# Patient Record
Sex: Male | Born: 1935 | Race: White | Hispanic: No | Marital: Single | Smoking: Never smoker
Health system: Southern US, Community
[De-identification: ages and names within clinical notes are randomized; demographics above are authoritative.]

## PROBLEM LIST (undated history)

## (undated) DIAGNOSIS — E785 Hyperlipidemia, unspecified: Secondary | ICD-10-CM

## (undated) DIAGNOSIS — K219 Gastro-esophageal reflux disease without esophagitis: Secondary | ICD-10-CM

## (undated) DIAGNOSIS — I10 Essential (primary) hypertension: Secondary | ICD-10-CM

## (undated) DIAGNOSIS — N289 Disorder of kidney and ureter, unspecified: Secondary | ICD-10-CM

## (undated) DIAGNOSIS — I441 Atrioventricular block, second degree: Secondary | ICD-10-CM

## (undated) DIAGNOSIS — I480 Paroxysmal atrial fibrillation: Secondary | ICD-10-CM

## (undated) DIAGNOSIS — G459 Transient cerebral ischemic attack, unspecified: Secondary | ICD-10-CM

## (undated) DIAGNOSIS — I2510 Atherosclerotic heart disease of native coronary artery without angina pectoris: Secondary | ICD-10-CM

## (undated) DIAGNOSIS — G4730 Sleep apnea, unspecified: Secondary | ICD-10-CM

## (undated) DIAGNOSIS — M1990 Unspecified osteoarthritis, unspecified site: Secondary | ICD-10-CM

## (undated) DIAGNOSIS — I4892 Unspecified atrial flutter: Secondary | ICD-10-CM

## (undated) DIAGNOSIS — C61 Malignant neoplasm of prostate: Secondary | ICD-10-CM

## (undated) HISTORY — PX: ESOPHAGOGASTRIC FUNDOPLICATION: SHX405

## (undated) HISTORY — DX: Transient cerebral ischemic attack, unspecified: G45.9

## (undated) HISTORY — DX: Atherosclerotic heart disease of native coronary artery without angina pectoris: I25.10

## (undated) HISTORY — PX: OTHER SURGICAL HISTORY: SHX169

## (undated) HISTORY — DX: Disorder of kidney and ureter, unspecified: N28.9

## (undated) HISTORY — DX: Sleep apnea, unspecified: G47.30

## (undated) HISTORY — DX: Essential (primary) hypertension: I10

## (undated) HISTORY — PX: HERNIA REPAIR: SHX51

## (undated) HISTORY — PX: A FLUTTER ABLATION: SHX5348

## (undated) HISTORY — DX: Hyperlipidemia, unspecified: E78.5

## (undated) HISTORY — DX: Malignant neoplasm of prostate: C61

---

## 2004-08-21 ENCOUNTER — Inpatient Hospital Stay: Payer: Self-pay | Admitting: Internal Medicine

## 2004-08-21 ENCOUNTER — Other Ambulatory Visit: Payer: Self-pay

## 2004-08-22 ENCOUNTER — Other Ambulatory Visit: Payer: Self-pay

## 2005-08-24 ENCOUNTER — Ambulatory Visit: Payer: Self-pay | Admitting: Pain Medicine

## 2005-08-26 ENCOUNTER — Ambulatory Visit: Payer: Self-pay | Admitting: Unknown Physician Specialty

## 2005-09-07 ENCOUNTER — Ambulatory Visit: Payer: Self-pay | Admitting: Pain Medicine

## 2005-09-20 ENCOUNTER — Ambulatory Visit: Payer: Self-pay | Admitting: Pain Medicine

## 2005-09-21 ENCOUNTER — Encounter: Payer: Self-pay | Admitting: Pain Medicine

## 2006-05-18 ENCOUNTER — Ambulatory Visit: Payer: Self-pay

## 2006-05-22 ENCOUNTER — Emergency Department: Payer: Self-pay | Admitting: Emergency Medicine

## 2006-05-22 ENCOUNTER — Other Ambulatory Visit: Payer: Self-pay

## 2006-06-09 ENCOUNTER — Ambulatory Visit: Payer: Self-pay

## 2007-03-30 ENCOUNTER — Ambulatory Visit: Payer: Self-pay | Admitting: Radiation Oncology

## 2007-04-02 ENCOUNTER — Ambulatory Visit: Payer: Self-pay | Admitting: Radiation Oncology

## 2007-04-18 ENCOUNTER — Ambulatory Visit: Payer: Self-pay | Admitting: Internal Medicine

## 2007-04-27 ENCOUNTER — Ambulatory Visit: Payer: Self-pay | Admitting: Cardiovascular Disease

## 2007-04-27 ENCOUNTER — Ambulatory Visit (HOSPITAL_COMMUNITY): Admission: RE | Admit: 2007-04-27 | Discharge: 2007-04-28 | Payer: Self-pay | Admitting: Cardiovascular Disease

## 2007-05-02 ENCOUNTER — Ambulatory Visit: Payer: Self-pay | Admitting: Radiation Oncology

## 2007-05-10 ENCOUNTER — Ambulatory Visit: Payer: Self-pay | Admitting: Internal Medicine

## 2007-06-02 ENCOUNTER — Ambulatory Visit: Payer: Self-pay | Admitting: Radiation Oncology

## 2007-07-03 ENCOUNTER — Ambulatory Visit: Payer: Self-pay | Admitting: Radiation Oncology

## 2007-07-06 ENCOUNTER — Ambulatory Visit: Payer: Self-pay | Admitting: Ophthalmology

## 2007-08-02 ENCOUNTER — Ambulatory Visit: Payer: Self-pay | Admitting: Radiation Oncology

## 2007-08-25 ENCOUNTER — Ambulatory Visit: Payer: Self-pay | Admitting: Cardiology

## 2007-08-29 ENCOUNTER — Ambulatory Visit: Payer: Self-pay | Admitting: Cardiology

## 2007-08-29 LAB — CONVERTED CEMR LAB
Basophils Absolute: 0 10*3/uL (ref 0.0–0.1)
Basophils Relative: 0 % (ref 0.0–1.0)
CO2: 33 meq/L — ABNORMAL HIGH (ref 19–32)
Calcium: 9.6 mg/dL (ref 8.4–10.5)
Eosinophils Relative: 4.4 % (ref 0.0–5.0)
Glucose, Bld: 100 mg/dL — ABNORMAL HIGH (ref 70–99)
Hemoglobin: 14 g/dL (ref 13.0–17.0)
MCV: 98.2 fL (ref 78.0–100.0)
Monocytes Absolute: 1.1 10*3/uL — ABNORMAL HIGH (ref 0.2–0.7)
Monocytes Relative: 17.1 % — ABNORMAL HIGH (ref 3.0–11.0)
Neutro Abs: 4 10*3/uL (ref 1.4–7.7)
Neutrophils Relative %: 64.9 % (ref 43.0–77.0)
WBC: 6.3 10*3/uL (ref 4.5–10.5)
aPTT: 28.6 s (ref 21.7–29.8)

## 2007-09-02 ENCOUNTER — Ambulatory Visit: Payer: Self-pay | Admitting: Radiation Oncology

## 2007-09-04 ENCOUNTER — Inpatient Hospital Stay (HOSPITAL_COMMUNITY): Admission: AD | Admit: 2007-09-04 | Discharge: 2007-09-06 | Payer: Self-pay | Admitting: Cardiovascular Disease

## 2007-09-04 ENCOUNTER — Inpatient Hospital Stay (HOSPITAL_BASED_OUTPATIENT_CLINIC_OR_DEPARTMENT_OTHER): Admission: RE | Admit: 2007-09-04 | Discharge: 2007-09-04 | Payer: Self-pay | Admitting: Cardiovascular Disease

## 2007-09-04 ENCOUNTER — Ambulatory Visit: Payer: Self-pay | Admitting: Cardiovascular Disease

## 2007-09-25 ENCOUNTER — Ambulatory Visit: Payer: Self-pay | Admitting: Cardiology

## 2007-11-20 ENCOUNTER — Ambulatory Visit: Payer: Self-pay | Admitting: Cardiology

## 2007-11-30 ENCOUNTER — Ambulatory Visit: Payer: Self-pay | Admitting: Radiation Oncology

## 2007-12-03 ENCOUNTER — Ambulatory Visit: Payer: Self-pay | Admitting: Radiation Oncology

## 2008-05-10 ENCOUNTER — Ambulatory Visit: Payer: Self-pay | Admitting: Cardiology

## 2008-09-12 ENCOUNTER — Ambulatory Visit: Payer: Self-pay | Admitting: Cardiology

## 2008-11-01 ENCOUNTER — Ambulatory Visit: Payer: Self-pay | Admitting: Radiation Oncology

## 2008-11-28 ENCOUNTER — Ambulatory Visit: Payer: Self-pay | Admitting: Radiation Oncology

## 2008-12-02 ENCOUNTER — Ambulatory Visit: Payer: Self-pay | Admitting: Radiation Oncology

## 2009-02-13 ENCOUNTER — Telehealth: Payer: Self-pay | Admitting: Cardiology

## 2009-03-28 DIAGNOSIS — I2510 Atherosclerotic heart disease of native coronary artery without angina pectoris: Secondary | ICD-10-CM | POA: Insufficient documentation

## 2009-03-28 DIAGNOSIS — C61 Malignant neoplasm of prostate: Secondary | ICD-10-CM

## 2009-03-28 DIAGNOSIS — E119 Type 2 diabetes mellitus without complications: Secondary | ICD-10-CM

## 2009-03-28 DIAGNOSIS — G459 Transient cerebral ischemic attack, unspecified: Secondary | ICD-10-CM | POA: Insufficient documentation

## 2009-03-28 DIAGNOSIS — G4730 Sleep apnea, unspecified: Secondary | ICD-10-CM | POA: Insufficient documentation

## 2009-03-28 DIAGNOSIS — E785 Hyperlipidemia, unspecified: Secondary | ICD-10-CM

## 2009-03-28 DIAGNOSIS — I4891 Unspecified atrial fibrillation: Secondary | ICD-10-CM | POA: Insufficient documentation

## 2009-03-28 DIAGNOSIS — N259 Disorder resulting from impaired renal tubular function, unspecified: Secondary | ICD-10-CM | POA: Insufficient documentation

## 2009-03-28 DIAGNOSIS — I10 Essential (primary) hypertension: Secondary | ICD-10-CM | POA: Insufficient documentation

## 2009-04-04 ENCOUNTER — Ambulatory Visit: Payer: Self-pay | Admitting: Cardiology

## 2009-04-04 DIAGNOSIS — R0989 Other specified symptoms and signs involving the circulatory and respiratory systems: Secondary | ICD-10-CM

## 2009-04-28 ENCOUNTER — Encounter: Payer: Self-pay | Admitting: Cardiology

## 2009-04-28 ENCOUNTER — Ambulatory Visit: Payer: Self-pay

## 2009-05-01 ENCOUNTER — Telehealth (INDEPENDENT_AMBULATORY_CARE_PROVIDER_SITE_OTHER): Payer: Self-pay | Admitting: *Deleted

## 2009-07-14 ENCOUNTER — Encounter: Payer: Self-pay | Admitting: Cardiology

## 2009-07-17 ENCOUNTER — Telehealth: Payer: Self-pay | Admitting: Cardiology

## 2009-07-18 ENCOUNTER — Encounter (INDEPENDENT_AMBULATORY_CARE_PROVIDER_SITE_OTHER): Payer: Self-pay | Admitting: *Deleted

## 2009-07-22 ENCOUNTER — Telehealth: Payer: Self-pay | Admitting: Cardiology

## 2009-08-20 ENCOUNTER — Telehealth (INDEPENDENT_AMBULATORY_CARE_PROVIDER_SITE_OTHER): Payer: Self-pay | Admitting: *Deleted

## 2009-10-06 ENCOUNTER — Encounter: Payer: Self-pay | Admitting: Cardiology

## 2009-10-07 ENCOUNTER — Ambulatory Visit: Payer: Self-pay | Admitting: Cardiology

## 2009-12-01 ENCOUNTER — Ambulatory Visit: Payer: Self-pay | Admitting: Cardiology

## 2009-12-01 LAB — CONVERTED CEMR LAB
Albumin: 3.7 g/dL (ref 3.5–5.2)
Bilirubin, Direct: 0.1 mg/dL (ref 0.0–0.3)
Cholesterol: 124 mg/dL (ref 0–200)
LDL Cholesterol: 57 mg/dL (ref 0–99)
Total Bilirubin: 0.6 mg/dL (ref 0.3–1.2)
Total Protein: 6.6 g/dL (ref 6.0–8.3)
VLDL: 32.4 mg/dL (ref 0.0–40.0)

## 2009-12-02 HISTORY — PX: CAROTID STENT: SHX1301

## 2010-01-05 ENCOUNTER — Encounter: Payer: Self-pay | Admitting: Cardiology

## 2010-01-05 ENCOUNTER — Inpatient Hospital Stay: Payer: Self-pay | Admitting: Internal Medicine

## 2010-01-05 ENCOUNTER — Ambulatory Visit: Payer: Self-pay | Admitting: Cardiovascular Disease

## 2010-01-06 ENCOUNTER — Encounter: Payer: Self-pay | Admitting: Cardiology

## 2010-01-07 ENCOUNTER — Encounter: Payer: Self-pay | Admitting: Cardiovascular Disease

## 2010-01-07 ENCOUNTER — Encounter: Payer: Self-pay | Admitting: Cardiology

## 2010-01-09 ENCOUNTER — Encounter: Payer: Self-pay | Admitting: Cardiovascular Disease

## 2010-01-09 ENCOUNTER — Encounter: Payer: Self-pay | Admitting: Cardiology

## 2010-03-16 ENCOUNTER — Observation Stay: Payer: Self-pay | Admitting: Internal Medicine

## 2010-03-16 ENCOUNTER — Encounter: Payer: Self-pay | Admitting: Cardiology

## 2010-03-17 ENCOUNTER — Encounter: Payer: Self-pay | Admitting: Cardiology

## 2010-03-18 ENCOUNTER — Encounter: Payer: Self-pay | Admitting: Cardiology

## 2010-03-19 ENCOUNTER — Encounter: Payer: Self-pay | Admitting: Cardiology

## 2010-04-07 ENCOUNTER — Ambulatory Visit: Payer: Self-pay | Admitting: Cardiology

## 2010-04-10 ENCOUNTER — Encounter: Payer: Self-pay | Admitting: Cardiology

## 2010-07-08 ENCOUNTER — Encounter: Payer: Self-pay | Admitting: Cardiology

## 2010-07-27 ENCOUNTER — Telehealth: Payer: Self-pay | Admitting: Cardiology

## 2010-07-30 ENCOUNTER — Telehealth: Payer: Self-pay | Admitting: Cardiology

## 2010-10-08 ENCOUNTER — Encounter: Payer: Self-pay | Admitting: Cardiology

## 2010-10-08 ENCOUNTER — Ambulatory Visit: Payer: Self-pay | Admitting: Cardiology

## 2010-10-14 ENCOUNTER — Telehealth (INDEPENDENT_AMBULATORY_CARE_PROVIDER_SITE_OTHER): Payer: Self-pay | Admitting: *Deleted

## 2010-12-02 NOTE — Assessment & Plan Note (Signed)
Summary: 6 MONTH ROV/SL   Visit Type:  Follow-up Primary Provider:  [name], MD  CC:  CAD.  History of Present Illness: The Patient presents for followup of coronary disease. Since I last saw him he was hospitalized at [facility] with a TIA. He was found to have right carotid artery stenosis and had stenting of this. He was discharged on Plavix in addition to his Coumadin. Aspirin is currently being held. I did review all available records and see that he had an echocardiogram demonstrating an EF of 55% with no significant abnormalities. Stress perfusion study demonstrated mid and basilar hypoperfusion but was a low risk scan. Hemoglobin A1c was 7.2. HDL 29, LDL 63. Since that hospitalization he has been doing well. He is out and walking around. He is having no chest pressure, neck or arm discomfort. He is having no palpitations, presyncope or syncope. He is having no new shortness of breath, PND or orthopnea.  Current Medications (verified): 1)  Carvedilol 3.125 Mg Tabs (Carvedilol) .... Take 1 Tablet By Mouth Twice A Day 2)  Isosorbide Mononitrate Cr 60 Mg Xr24h-Tab (Isosorbide Mononitrate) .Marland Kitchen.. 1 By Mouth Daily 3)  Niaspan 1000 Mg Cr-Tabs (Niacin (Antihyperlipidemic)) .... One and 1/2 Tablets Daily 4)  Simvastatin 40 Mg Tabs (Simvastatin) 5)  Hydrochlorothiazide 25 Mg Tabs (Hydrochlorothiazide) .... Daily 6)  Aspirin 81 Mg  Tabs (Aspirin) .... Daily 7)  Warfarin .... As Directed 8)  Multivitamins   Tabs (Multiple Vitamin) .... Daily 9)  Fish Oil   Oil (Fish Oil) .... Daily 10)  Melatonin 1.5mg  .... Daily 11)  Nitrostat 0.4 Mg Subl (Nitroglycerin) .... As Needed 12)  Testosterone Cypionate 200 Mg/ml Oil (Testosterone Cypionate) .... As Directed 13)  Plavix 75 Mg Tabs (Clopidogrel Bisulfate) .Marland Kitchen.. 1 By Mouth Daily  Allergies (verified): No Known Drug Allergies  Past History:  Past Medical History:  1. Coronary artery disease (catheterization in November 2008.  He had       a long  restenotic area, 95% stenosis in the previously placed LAD       stent.  The distal LAD had 30-40% stenosis.  Intermediate had 30-       40% stenosis.  The circumflex had 40% stenosis in obtuse marginal.       The right coronary artery had 30% followed by 70-80% stenosis       followed by total occlusion.  There were left-to-right collaterals.       He had stenting with a Taxus stent in the LAD.  The EF was 65%).   2. Paroxysmal atrial fibrillation on chronic Coumadin therapy.   3. Questionable transient ischemic attacks.   4. Sleep apnea.   5. Prostate cancer status post radiation.   6. Diabetes mellitus.   7. Hypertension.   8. Dyslipidemia.   9. Renal insufficiency.   10. TIA  11. Chronic renal insufficiency  Past Surgical History: Fundoplication.  Right carotid artery stenting (February 2011)  Review of Systems       As stated in the HPI and negative for all other systems.   Vital Signs:  Patient profile:   77 year old male Height:      71 inches Weight:      200 pounds BMI:     28.00 Pulse rate:   68 / minute Resp:     16 per minute BP sitting:   122 / 64  (right arm)  Vitals Entered By: Marrion Coy, CNA (April 07, 2010 1:44 PM)  Physical Exam  General:  Well developed, well nourished, in no acute distress. Head:  normocephalic and atraumatic Neck:  Neck supple, no JVD. No masses, thyromegaly or abnormal cervical nodes. Chest Wall:  no deformities or breast masses noted Lungs:  Clear bilaterally to auscultation and percussion. Abdomen:  Bowel sounds positive; abdomen soft and non-tender without masses, organomegaly, or hernias noted. No hepatosplenomegaly. Msk:  Back normal, normal gait. Muscle strength and tone normal. Extremities:  No clubbing or cyanosis. Neurologic:  Alert and oriented x 3. Skin:  Intact without lesions or rashes. Psych:  Normal affect.   Detailed Cardiovascular Exam  Neck    Carotids: right carotid bruit:.      Neck Veins: Normal, no  JVD.    Heart    Inspection: no deformities or lifts noted.      Palpation: normal PMI with no thrills palpable.      Auscultation: regular rate and rhythm, S1, S2 without murmurs, rubs, gallops, or clicks.    Vascular    Abdominal Aorta: no palpable masses, pulsations, or audible bruits.      Femoral Pulses: normal femoral pulses bilaterally.      Pedal Pulses: normal pedal pulses bilaterally.      Radial Pulses: normal radial pulses bilaterally.      Peripheral Circulation: no clubbing, cyanosis, or edema noted with normal capillary refill.     Impression & Recommendations:  Problem # 1:  CAD (ICD-414.00) The patient had a negative stress perfusion study or at least low risk.he will continue with risk reduction.  Problem # 2:  HYPERTENSION (ICD-401.9) His blood pressure is controlled. He will continue on the meds as listed.  Problem # 3:  ATRIAL FIBRILLATION, PAROXYSMAL (ICD-427.31) She remains on Coumadin. We discussed Plavix and his instruction is to continue the current prescription which last about 3 months longer. He will then restart aspirin and continue on Coumadin when the Plavix runs out.  Problem # 4:  DYSLIPIDEMIA (ICD-272.4) I reviewed his lipid profile. His HDL was still 29 with an LDL of 63. He does not want to take more niacin. He'll increase his walking to try to treat his HDL. There is also a beta neck cranberry juice might increase this. We discussed this.  Patient Instructions: 1)  Your physician recommends that you schedule a follow-up appointment in: 6 months with Dr [name] 2)  Your physician recommends that you continue on your current medications as directed. Please refer to the Current Medication list given to you today.

## 2010-12-02 NOTE — Assessment & Plan Note (Signed)
Summary: 6 month rov 414.01  due 12/11   Visit Type:  Follow-up Primary Roux Brandy:  [name], MD  CC:  CAD and Atrial Fibrillation.  History of Present Illness: The patient presents for routine followup. Since I last saw him he has had no cardiovascular complaints. He is going to have some teeth extracted and will need to come off Coumadin. He was working with physical therapy because of gait disturbance but is now doing this at home. He does occasionally get some chest pressure he walks quickly. He will occasionally take a nitroglycerin (about 2 in the last 6 months). This relieved his pain. He denies any resting complaints. He has no resting shortness of breath, PND or orthopnea. He has no palpitations, presyncope or syncope. He  Current Medications (verified): 1)  Carvedilol 3.125 Mg Tabs (Carvedilol) .... Take 1 Tablet By Mouth Twice A Day 2)  Isosorbide Mononitrate Cr 60 Mg Xr24h-Tab (Isosorbide Mononitrate) .Marland Kitchen.. 1 By Mouth Daily 3)  Slo-Niacin 500 Mg Cr-Tabs (Niacin) .... 3 By Mouth Daily 4)  Simvastatin 40 Mg Tabs (Simvastatin) .Marland Kitchen.. 1 By Mouth Daily 5)  Hydrochlorothiazide 25 Mg Tabs (Hydrochlorothiazide) .... Daily 6)  Aspirin 81 Mg  Tabs (Aspirin) .... Daily 7)  Warfarin .... As Directed 8)  Multivitamins   Tabs (Multiple Vitamin) .... Daily 9)  Fish Oil   Oil (Fish Oil) .... Daily 10)  Melatonin 1.5mg  .... Daily 11)  Nitrostat 0.4 Mg Subl (Nitroglycerin) .... As Needed 12)  Testosterone Cypionate 200 Mg/ml Oil (Testosterone Cypionate) .... As Directed  Allergies (verified): No Known Drug Allergies  Past History:  Past Medical History: Reviewed history from 04/07/2010 and no changes required.  1. Coronary artery disease (catheterization in November 2008.  He had       a long restenotic area, 95% stenosis in the previously placed LAD       stent.  The distal LAD had 30-40% stenosis.  Intermediate had 30-       40% stenosis.  The circumflex had 40% stenosis in obtuse  marginal.       The right coronary artery had 30% followed by 70-80% stenosis       followed by total occlusion.  There were left-to-right collaterals.       He had stenting with a Taxus stent in the LAD.  The EF was 65%).   2. Paroxysmal atrial fibrillation on chronic Coumadin therapy.   3. Questionable transient ischemic attacks.   4. Sleep apnea.   5. Prostate cancer status post radiation.   6. Diabetes mellitus.   7. Hypertension.   8. Dyslipidemia.   9. Renal insufficiency.   10. TIA  11. Chronic renal insufficiency  Past Surgical History: Reviewed history from 04/07/2010 and no changes required. Fundoplication.  Right carotid artery stenting (February 2011)  Review of Systems       As stated in the HPI and negative for all other systems.   Vital Signs:  Patient profile:   77 year old male Height:      71 inches Weight:      199 pounds BMI:     27.86 Pulse rate:   69 / minute Resp:     16 per minute BP sitting:   128 / 68  (right arm)  Vitals Entered By: Marrion Coy, CNA (October 08, 2010 12:01 PM)  Physical Exam  General:  Well developed, well nourished, in no acute distress. Head:  normocephalic and atraumatic Eyes:  PERRLA/EOM intact; conjunctiva and lids  normal. Mouth:  Teeth, gums and palate normal. Oral mucosa normal. Neck:  Neck supple, no JVD. No masses, thyromegaly or abnormal cervical nodes. Chest Wall:  no deformities  Lungs:  Clear bilaterally to auscultation and percussion. Abdomen:  Bowel sounds positive; abdomen soft and non-tender without masses, organomegaly, or hernias noted. No hepatosplenomegaly. Msk:  Back normal, normal gait. Muscle strength and tone normal. Extremities:  No clubbing or cyanosis. Neurologic:  Alert and oriented x 3. Skin:  Intact without lesions or rashes. Cervical Nodes:  no significant adenopathy Inguinal Nodes:  no significant adenopathy Psych:  Normal affect.   Detailed Cardiovascular Exam  Neck    Carotids:  right carotid bruit:.      Neck Veins: Normal, no JVD.    Heart    Inspection: no deformities or lifts noted.      Palpation: normal PMI with no thrills palpable.      Auscultation: regular rate and rhythm, S1, S2 without murmurs, rubs, gallops, or clicks.    Vascular    Abdominal Aorta: no palpable masses, pulsations, or audible bruits.      Femoral Pulses: normal femoral pulses bilaterally.      Pedal Pulses: normal pedal pulses bilaterally.      Radial Pulses: normal radial pulses bilaterally.      Peripheral Circulation: no clubbing, cyanosis, or edema noted with normal capillary refill.     EKG  Procedure date:  10/08/2010  Findings:      Sinus rhythm, rate 73, premature ventricular contractions, poor anterior R-wave progression, old anteroseptal infarct, left axis deviation  Impression & Recommendations:  Problem # 1:  ATRIAL FIBRILLATION, PAROXYSMAL (ICD-427.31) The patient will be okay to come off of his Coumadin 4 days prior to his oral surgery. It can be restarted as soon as it is felt safe from bleeding risk. Orders: EKG w/ Interpretation (93000)  Problem # 2:  HYPERTENSION (ICD-401.9) His blood pressure is controlled. We will continue the meds as listed.  Problem # 3:  CAD (ICD-414.00) He has stable angina.  I will continue the meds as listed.  Patient Instructions: 1)  Your physician recommends that you schedule a follow-up appointment in: 4 months with Dr [name] 2)  Your physician recommends that you continue on your current medications as directed. Please refer to the Current Medication list given to you today. 3)  OK to hold Coumadin 4 days prior to dental procedure - teeth extractions and restart when dentist says its OK. v.o. per Dr [name], MD/Pam Volney Presser, RN

## 2010-12-02 NOTE — Miscellaneous (Signed)
Summary: myoview  Clinical Lists Changes  Observations: Added new observation of NUCLEAR NOS: Pharmacologic myocardial perfusion imaging study with a small region of ischemia noted in hte proximal to mid anterior wall. This is consistent with the patient's known anatomy that he has occluded diagonal vessel on his last cath. The mildly drecreased perfusion noted in the basal inferior wall is consisistent with diphragmatic attenuation artifact versus old scar. EF is 52%. No significant EKG changes.  This is a low to moderate risk scna consistent with the pt's known anatomy. He does have a LAD stent and this would appear to be patent with good perfusion seen in the distal anterior wall and apical region.   (01/07/2010 15:46)      Nuclear Study  Procedure date:  01/07/2010  Findings:      Pharmacologic myocardial perfusion imaging study with a small region of ischemia noted in hte proximal to mid anterior wall. This is consistent with the patient's known anatomy that he has occluded diagonal vessel on his last cath. The mildly drecreased perfusion noted in the basal inferior wall is consisistent with diphragmatic attenuation artifact versus old scar. EF is 52%. No significant EKG changes.  This is a low to moderate risk scna consistent with the pt's known anatomy. He does have a LAD stent and this would appear to be patent with good perfusion seen in the distal anterior wall and apical region.

## 2010-12-02 NOTE — Progress Notes (Signed)
Summary: pt cannot afford meds    Phone Note Call from Patient Call back at [phone]   Caller: pt Reason for Call: Talk to Nurse Summary of Call: pt would like to try another medication he can not afford the niaspan that he is on and the cost went up to $700. Initial call taken by: Faythe Ghee,  July 27, 2010 2:03 PM  Follow-up for Phone Call        left message for pt to call back to discuss medication change.  Sander Nephew, RN  Additional Follow-up for Phone Call Additional follow up Details #1::        Patient can take OTC sustained release niacin 1500 mg daily. Additional Follow-up by: [name], MD, [facility],  July 27, 2010 2:40 PM    Additional Follow-up for Phone Call Additional follow up Details #2::    pt aware of the above Follow-up by: Charolotte Capuchin, RN,  July 27, 2010 5:50 PM

## 2010-12-02 NOTE — Progress Notes (Signed)
Summary: pt needs help with medication  Phone Note Call from Patient Call back at Home Phone [phone] Call back at [phone] Message from:  [facility] [address] in [place]  Caller: Patient Reason for Call: Talk to Nurse, Talk to Doctor Summary of Call: pt was told he can get Niospan OTC and he dosen't remember what the name of medication was. He can't afford the name brand Niospan  Initial call taken by: Omer Filippo,  July 30, 2010 4:04 PM  Follow-up for Phone Call        pt is use Niacin 1500 mg, called pt at pharmacy to let him know Follow-up by: Charolotte Capuchin, RN,  July 30, 2010 4:26 PM

## 2010-12-02 NOTE — Letter (Signed)
Summary: [facility] Procedure Note  [facility] Procedure Note   Imported By: Roderic Ovens 05/06/2010 12:03:03  _____________________________________________________________________  External Attachment:    Type:   Image     Comment:   External Document

## 2010-12-02 NOTE — Letter (Signed)
Summary: Patient's Note  Patient's Note   Imported By: Roderic Ovens 04/27/2010 08:49:55  _____________________________________________________________________  External Attachment:    Type:   Image     Comment:   External Document

## 2010-12-02 NOTE — Letter (Signed)
Summary: Custom - Lipid  [facility], Main Office  [address]   [place], [place] [address]   Phone: [phone]  Fax: [phone]         December 01, 2009 MRN: [id number]   [facility] [address] [place], [place]  [address]   Dear Mr. Reichard,  We have reviewed your cholesterol results.  They are as follows:     Total Cholesterol:    124 (Desirable: less than 200)       HDL  Cholesterol:     35.00  (Desirable: greater than 40 for men and 50 for women)       LDL Cholesterol:       57  (Desirable: less than 100 for low risk and less than 70 for moderate to high risk)       Triglycerides:       162.0  (Desirable: less than 150)  Our recommendations include:  Continue same   Call our office at the number listed above if you have any questions.  Lowering your LDL cholesterol is important, but it is only one of a large number of "risk factors" that may indicate that you are at risk for heart disease, stroke or other complications of hardening of the arteries.  Other risk factors include:   A.  Cigarette Smoking* B.  High Blood Pressure* C.  Obesity* D.   Low HDL Cholesterol (see yours above)* E.   Diabetes Mellitus (higher risk if your is uncontrolled) F.  Family history of premature heart disease G.  Previous history of stroke or cardiovascular disease    *These are risk factors YOU HAVE CONTROL OVER.  For more information, visit .      There is now evidence that lowering the TOTAL CHOLESTEROL AND LDL CHOLESTEROL can reduce the risk of heart disease.  The American Heart Association recommends the following guidelines for the treatment of elevated cholesterol:  1.  If there is now current heart disease and less than two risk factors, TOTAL CHOLESTEROL should be less than 200 and LDL CHOLESTEROL should be less than 100. 2.  If there is current heart disease or two or more risk factors, TOTAL CHOLESTEROL should be less than 200 and LDL CHOLESTEROL  should be less than 70.  A diet low in cholesterol, saturated fat, and calories is the cornerstone of treatment for elevated cholesterol.  Cessation of smoking and exercise are also important in the management of elevated cholesterol and preventing vascular disease.  Studies have shown that 30 to 60 minutes of physical activity most days can help lower blood pressure, lower cholesterol, and keep your weight at a healthy level.  Drug therapy is used when cholesterol levels do not respond to therapeutic lifestyle changes (smoking cessation, diet, and exercise) and remains unacceptably high.  If medication is started, it is important to have you levels checked periodically to evaluate the need for further treatment options.  Thank you,     Dr Fayrene Fearing Dulcey Riederer/ Sander Nephew, RN [facility] Team

## 2010-12-02 NOTE — Op Note (Signed)
Summary: Right Carotid Artery Stenting  Right Carotid Artery Stenting   Imported By: Harlon Flor 01/14/2010 08:35:22  _____________________________________________________________________  External Attachment:    Type:   Image     Comment:   External Document

## 2010-12-02 NOTE — Letter (Signed)
Summary: CT HEAT W/WO CM ; DXR PORTABLE CHEST 1 VIEW  CT HEAT W/WO CM ; DXR PORTABLE CHEST 1 VIEW   Imported By: Roderic Ovens 05/06/2010 12:04:06  _____________________________________________________________________  External Attachment:    Type:   Image     Comment:   External Document

## 2010-12-03 NOTE — Progress Notes (Signed)
  EMSI Request received sent to [facility] [place]  October 14, 2010 1:46 PM

## 2011-03-16 NOTE — Assessment & Plan Note (Signed)
[facility]                            CARDIOLOGY OFFICE NOTE   Shawn, Reyes                           MRN:          [id number]  DATE:09/12/2008                            DOB:          1921-06-27    PRIMARY CARE PHYSICIAN:  Dr. [name].   REASON FOR PRESENTATION:  Evaluate patient with coronary disease.   HISTORY OF PRESENT ILLNESS:  The patient returns for followup of the  above.  Since I last saw him, he has done well.  He denies any new chest  discomfort, neck or arm discomfort.  In fact he says the sense of  fullness he had with exertion is no longer there unless he walks very  quickly.  He is fatigued and needs to take a couple of naps during the  day.  However, he feels much better when he does this.  He has a little  trouble falling asleep at night and takes Ambien.  He is denying any  resting shortness of breath.  He has not been having any PND or  orthopnea.  He has no palpitations, presyncope, or syncope.   PAST MEDICAL HISTORY:  1. Coronary artery disease (catheterization in November 2008.  He had      a long restenotic area, 95% stenosis in the previously placed LAD      stent.  The distal LAD had 30-40% stenosis.  Intermediate had 30-      40% stenosis.  The circumflex had 40% stenosis in obtuse marginal.      The right coronary artery had 30% followed by 70-80% stenosis      followed by total occlusion.  There were left-to-right collaterals.      He had stenting with a Taxus stent in the LAD.  The EF was 65%).  2. Paroxysmal atrial fibrillation on chronic Coumadin therapy.  3. Questionable transient ischemic attacks.  4. Sleep apnea.  5. Prostate cancer status post radiation.  6. Diabetes mellitus.  7. Hypertension.  8. Dyslipidemia.  9. Renal insufficiency.   ALLERGIES:  None.   MEDICATIONS:  1. Zocor 40 mg daily.  2. Coumadin.  3. Fish oil.  4. Multivitamin.  5. Plavix 75 mg daily.  6. Coreg 3.125 mg b.i.d.  7.  Hydrochlorothiazide 12.5 mg daily.  8. Imdur 60 mg daily.  9. Lexapro 7.5 mg daily.  10.Testosterone shots.   REVIEW OF SYSTEMS:  As stated in the HPI and otherwise negative for all  other systems.   PHYSICAL EXAMINATION:  GENERAL:  The patient is pleasant and in no  distress.  VITAL SIGNS:  Blood pressure 135/61, heart rate 60 and regular, weight  203 pounds, body mass index 28.  HEENT:  Eyes unremarkable.  Pupils equal, round, and reactive to light.  Fundi not visualized.  Oral mucosa unremarkable.  NECK:  No jugular venous distention at 45 degrees.  Carotid upstroke  brisk and symmetric.  No bruits, no thyromegaly.  LYMPHATICS:  No cervical, axillary, or inguinal adenopathy.  LUNGS:  Clear to auscultation bilaterally.  BACK:  No costovertebral angle tenderness.  CHEST:  Unremarkable.  HEART:  PMI not displaced or sustained.  S1 and S2 within normal limits.  No S3, no S4, no clicks, no rubs, no murmurs.  ABDOMEN:  Flat.  Positive bowel sounds normal in frequency and pitch.  No bruits, no rebound, no guarding, no midline pulsatile mass, no  hepatomegaly, no splenomegaly.  SKIN:  No rashes, no nodules.  EXTREMITIES:  2+ upper pulse, 2+ femorals, 1+ dorsalis pedis posterior  tibialis bilaterally, no edema, no cyanosis, no clubbing.  NEUROLOGIC:  Oriented to person, place, and time.  Cranial nerves II  through XII grossly intact.  Motor grossly intact.   EKG, sinus rhythm, rate 59, old inferior infarct, left axis deviation,  old anteroseptal infarct, first-degree AV block, no acute ST-T wave  changes.   ASSESSMENT/PLAN:  1. Coronary disease.  The patient is doing well with respect to this.      He is having no ongoing symptoms.  No further cardiovascular      testing is suggested.  He can now stop the Plavix as it has been 1      year since his stent placement.  He will restart aspirin 81 mg      daily.  2. Dyslipidemia.  He does still have a low HDL at 31.5.  He did not       tolerate niacin in the past, but he is willing to try this again.      I think this is very reasonable.  He is going to start Niaspan 500      mg every morning.  He does not want to take it at night.  He will      take his aspirin half an hour before and we gave him some other      hints to try to avoid the flushing.  If he is still taking this in      6 weeks, he should get liver profile and lipids.  He has been given      instructions on this.  3. Atrial fibrillation.  He has not had any symptomatic paroxysms of      this.  He will continue on the warfarin and low-dose aspirin.  4. Dyslipidemia.  5. Hypertension.  Blood pressure will be managed in the context of      treating all of the above.  6. Renal insufficiency, per Dr. Dan Humphreys.  7. Followup.  I will see him back in about 4 months since I have made      this medication titration and probably a couple of times a year      thereafter.     [name], MD, [facility]  Electronically Signed    JH/MedQ  DD: 09/13/2008  DT: 09/14/2008  Job #: [id number]   cc:   [name]

## 2011-03-16 NOTE — Assessment & Plan Note (Signed)
[facility]                            CARDIOLOGY OFFICE NOTE   Shawn Reyes, Shawn Reyes                           MRN:          [id number]  DATE:08/29/2007                            DOB:          12/07/1920    REASON FOR PRESENTATION:  Evaluate the patient with chest discomfort.   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  I saw him a few  days ago in [place].  He presented with chest discomfort that had  been new for about 10 days.  At that point in time, I was concerned  about this new onset angina similar to previous.  I did start him on  Imdur 60 mg daily to see if there was any improvement in his symptoms.  However, over the last 3 to 4 days, he has continued to have chest  discomfort.  He has taken about 3 sublingual nitroglycerin.  He had one  episode while walking to the mailbox and another after eating.  It is  substernal discomfort.  It is mild.  It is similar to his previous  angina.  There is not associated nausea, vomiting, diaphoresis, or  shortness of breath.  There is no radiation to his jaw or to his arms.  It goes away after a few minutes with taking a nitroglycerin.  He  presented today for scheduled followup to discuss further management.   PAST MEDICAL HISTORY:  Coronary artery disease.  Status post stenting of  his right coronary artery, catheterization June 2006 found left main  luminal irregularities, 20-30% osteal stenosis.  There was an LAD with a  99% lesion.  This was treated with a non drug-eluting stent.  The first  diagonal had 75-80% stenosis.  The circumflex was free of high-grade  disease.  The right coronary artery had focal 70% stenosis in the mid  portion.  There was a 90% stenosis leading into a stent in the distal  vessel.  The vessel was occluded in the proximal portion of the stent.  The PDA filled via collaterals.  The EF was  mildly reduced by Myoview  41%.  Paroxysmal atrial fibrillation in the past with chronic Coumadin  therapy.  Questionable transient ischemic attacks.  Prostate cancer.  Diabetes mellitus.  Hypertension.  Dyslipidemia.  Renal insufficiency.   ALLERGIES:  None.   MEDICATIONS:  1. Aspirin 81 mg daily.  2. Chelated minerals.  3. Diovan 80 mg daily.  4. Fish oil 1000 mg b.i.d.  5. Hydrochlorothiazide 25 mg daily.  6. Multivitamin.  7. Simvastatin 40 mg daily.  8. Niaspan (the patient actually stopped taking this and was taking      over-the-counter Slo-Niacin).   REVIEW OF SYSTEMS:  As stated in the HPI and negative for other systems.   PHYSICAL EXAMINATION:  The patient is pleasant and in no distress.  Blood pressure 120/64, heart rate is 71 and regular, weight 201 pounds,  body mass index 29.  HEENT:  Eyelids unremarkable.  Pupils are equal, round,  and reactive to  light and accommodation.  Fundi are not visualized.  Oral mucosa  unremarkable.  NECK:  No jugular venous distension at 45 degrees, carotid upstroke  brisk and symmetric, no bruits, thyromegaly.  LYMPHATICS:  No cervical, axillary, or inguinal adenopathy.  LUNGS:  Clear to auscultation bilaterally.  BACK:  No costovertebral angle tenderness.  CHEST:  Unremarkable.  HEART:  PMI not displaced or sustained, S1 and S2 within normal limits,  no S3, no S4, no clicks, rubs, murmurs.  ABDOMEN:  Flat, positive bowel sounds, normal in frequency and pitch, no  bruits, rebound, guarding.  No midline pulsatile masses, hepatomegaly,  splenomegaly.  SKIN:  No rashes, no nodules.  EXTREMITIES:  2+ upper pulses, 2+ femorals, 1+ dorsalis pedis and  posterior tibialis bilaterally.  No edema, cyanosis, clubbing.  NEURO:  Oriented to person, place, and time, cranial nerves 2-12 grossly  intact, motor grossly intact.   EKG shows sinus rhythm, rate 71, left axis deviation, poor anterior R  wave progression, no acute ST-T wave  changes.   ASSESSMENT AND PLAN:  1. Coronary artery disease.  The patient continues to have chest pain      despite the addition of Imdur.  He is otherwise on a reasonable      regimen.  These are new symptoms and so constitute unstable angina.      Given this, the next step will be a cardiac catheterization.  He      understands the risks and benefits of this clearly.  We will have      to make sure his creatinine is acceptable.  He will have to come      off of his Coumadin.  I think he is moderately low-risk for      thromboembolic events and would not need to be bridged with      Lovenox.  We will have him stop his Coumadin 4 days prior to this      outpatient procedure.  There is a possibility that, if his      creatinine is elevated, he may need to be hospitalized for      hydration prior to the procedure.  For now, he will continue the      medications as listed.  2. Dyslipidemia.  I switched him to simvastatin for cost      considerations at the last appointment.  I am not fond of Slo-      Niacin over-the-counter, and if he is going to take this, I would      suggest immediate release.  He is going to go back to the Niaspan      and start at 500 mg.  I instructed him on how to use this.  3. Followup will be at the time of the cardiac catheterization.     [name], MD, [facility]  Electronically Signed    JH/MedQ  DD: 08/29/2007  DT: 08/29/2007  Job #: [id number]   cc:   [name]

## 2011-03-16 NOTE — Assessment & Plan Note (Signed)
[facility] OFFICE NOTE   Shawn Reyes, Shawn Reyes                           MRN:          [id number]  DATE:08/25/2007                            DOB:          14-Jun-1921    PRIMARY CARE PHYSICIAN:  Dr. [name].   REASON FOR PRESENTATION:  Evaluate patient with chest discomfort.   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  He has a history of coronary disease status  post recent stenting of his LAD with details listed below.  He has been  doing well since that time.  He did have radiation therapy for prostate  cancer this summer.  He walks daily for exercise.  He has noticed,  however, with these walks that he is starting to get some discomfort in  his throat.  This is somewhat similar to his previous angina.  It is  much milder, however.  He is not describing substernal chest pressure or  arm discomfort.  He is not describing associated shortness of breath,  palpitations, or excessive diaphoresis.  He is not particularly having  these symptoms at rest, though last night he noticed after he ate  something in the middle of the night, a little discomfort.  He says he  has taken about 5 nitroglycerin over the past 7 to 10 days.  He says the  discomfort always goes away quickly with the nitroglycerin.  He actually  can keep going after he takes a nitroglycerin, finish his walk, and not  get any recurrent discomfort.   PAST MEDICAL HISTORY:  1. Coronary artery disease (patient had previous stenting of his right      coronary artery.  His most recent catheterization was June 2006.      At that time, he was found to have left main luminal irregularities      with 20% to 30% ostial stenosis, there was an LAD with a 99%      lesion.  This was treated with a non-drug-eluting stent.  The 1st      diagonal had a 75% to 80% stenosis.  The circumflex was free of      high-grade  disease.  The right coronary artery had a focal 70%      stenosis in the mid portion.  There was 90% stenosis leading in to      his stent in the distal vessel.  The vessel was occluded in the      proximal portion of the stent.  The PDA filled the collaterals.      The EF is apparently mildly reduced, by Myoview it has been 41%).  2. Paroxysmal atrial fibrillation in the past.  3. Questionable transient ischemic attacks.  4. Chronic Coumadin therapy.  5. Prostate cancer.  6. Diabetes mellitus.  7. Hypertension.  8. Dyslipidemia.  9. Renal insufficiency.   ALLERGIES:  NONE.   MEDICATIONS:  1. Aspirin 81 mg daily.  2. Chelated minerals.  3. Diovan 80 mg daily.  4. Fish oil 1000 mg b.i.d.  5. Hydrochlorothiazide 25 mg daily.  6. Multivitamin.  7. Vytorin 10/20.  8. Niaspan 500 mg daily.   REVIEW OF SYSTEMS:  As stated in the HPI, and otherwise negative for  other systems.   PHYSICAL EXAMINATION:  The patient is in no distress.  Blood pressure 114/60.  Heart rate 67 and regular.  Weight 199 pounds.  HEENT:  Eyes unremarkable.  Pupils equal, round, and reactive to light.  Fundi not visualized.  Oral mucosa unremarkable.  NECK:  No jugular venous distention at 45 degrees.  Carotid upstroke  brisk and symmetric.  No bruits.  No thyromegaly.  LYMPHATICS:  No cervical, axillary, or inguinal adenopathy.  LUNGS:  Clear to auscultation bilaterally.  BACK:  No costovertebral angle tenderness.  CHEST:  Unremarkable.  HEART:  PMI not displaced or sustained.  S1 and S2 within normal limits.  No S3.  No murmurs.  ABDOMEN:  Mildly obese.  Positive bowel sounds.  Normal in frequency and  pitch.  No bruits.  No rebound.  No guarding.  No midline pulsatile  mass.  No hepatomegaly.  No splenomegaly.  SKIN:  No rashes.  No nodules.  EXTREMITIES:  Two plus upper pulses.  Two plus femorals.  One plus  dorsalis pedis and posterior tibialis bilaterally.  No edema.  NEUROLOGIC:  Oriented to  person, place, and time.  Cranial nerves 2  through 12 grossly intact.  Motor grossly intact.  EKG:  Sinus rhythm.  Rate 67.  Axis within normal limits.  Intervals  within normal limits.  No acute ST-T wave changes.   ASSESSMENT ON LIMB MOVEMENT:  1. Exertional angina.  The patient is having new chest pain consistent      with exertional angina.  At this point, this could be related to      his known obstructive coronary artery disease versus new lesion or      a problem with the recently placed stent.  The symptoms are mild,      and with exertion.  He would like to avoid catheterization, though      I think we may need to do this.  I do think it is very reasonable      to add Imdur to his medical regimen, and I will start this at 60      mg.  He knows to come to the emergency room should he have any      recurrent discomfort this weekend.  I have told him not to go to      the [facility] football game, which was his plan this weekend.  Rather, we      will see him quickly next week, and if he has continued symptoms, I      will push for cardiac catheterization.  Of note, he has been      intolerant of beta blockers, and so I will not start these.  He      will remain on the other medicines as listed.  He was on Plavix for      a month following his stent placement, but was taken off this      because he is also on chronic Coumadin therapy, as well as aspirin.  2. Hypertension.  Blood pressure is well controlled.  Continue the      medications as listed.  3. Atrial fibrillation.  He is on chronic Coumadin therapy.  We would      need to stop this before any catheterization.  4. Dyslipidemia.  The patient had great concerns about Vytorin.  His      wife brought in an article and read it to me in the office.  I have      taken the liberty of discontinuing the Vytorin, and starting      Simvastatin 40 mg.  In 8 weeks he will get a lipid profile and      liver enzymes.  We will      make  adjustments as needed.  5. Followup.  I will see the patient back in about 3 or 4 days.     [name], MD, [facility]  Electronically Signed    JH/MedQ  DD: 08/25/2007  DT: 08/26/2007  Job #: [id number]   cc:   [name]

## 2011-03-16 NOTE — Cardiovascular Report (Signed)
NAME:  Shawn Reyes, Shawn Reyes NO.:  [id number]   MEDICAL RECORD NO.:  [id number]          PATIENT TYPE:  INP   LOCATION:  4729                         FACILITY:  [facility]   PHYSICIAN:  [name], MD, FACCDATE OF BIRTH:  1921/02/12   DATE OF PROCEDURE:  DATE OF DISCHARGE:                            CARDIAC CATHETERIZATION   INDICATIONS:  Recurrent angina status post recent stent to the mid-LAD.   Today, catheterization was done with 5-French catheters from right  femoral artery.   Left main coronary artery was a short segment.  It appeared to have a 30-  40% ostial lesion.  There was no catheter damping.  Proximal LAD was  normal.  The mid LAD had long area of restenosis in the recently placed  stent.  The degree of stenosis was in the 95% range.  This also included  the takeoff of a large first diagonal branch which had an 80% ostial  stenosis.  Distal LAD had 30-40% multiple discrete lesions.   There was a small intermediate branch with 30-40% discrete disease.   Circumflex coronary artery primarily consisted of a large second obtuse  marginal branch.  The mid circ had 40% multiple discrete lesions.  The  obtuse marginal branch was without critical disease.   The patient did have some left-to-right collaterals to the distal RCA.  Right coronary artery had 30% discrete disease proximally.  The  midvessel had a 70-80% eccentric lesion.  The vessel was 100% occluded  proximal to the previously placed distal stent.  There was a large RV  groove branch with an 80% ostial stenosis.   Again, there were some left-to-right collaterals to the distal RCA.   RAO VENTRICULOGRAPHY:  RAE ventriculography was normal.  EF was 65%.  There is no gradient across aortic valve.  No MR.   Aortic pressure was 153/63, LV pressure was 150/18.   IMPRESSION:  The films will be reviewed with Dr. [name].  I suspect that  he will want to re-intervene on the LAD.  However, open-heart  surgery is  a possibility despite the patient's age.  He is quite vigorous and  healthy.  I suspect that the diagonal LAD and PDA could be bypassed.   The patient does have anticoagulation needs in regards to Coumadin.  Dr.  [name] had wanted to place him on Plavix for the short term and  therefore I believe he got a non drug-eluting stent to the LAD.   He has good LV function and I think that either course would be  reasonable, although I suspect again that the interventionalist will  want to try to recannulate his LAD percutaneously at least for the first  episode of restenosis.      [name], MD, [facility]  Electronically Signed     PCN/MEDQ  D:  09/04/2007  T:  09/04/2007  Job:  [id number]   cc:   [name], MD, [facility]

## 2011-03-16 NOTE — Discharge Summary (Signed)
NAME:  Shawn Reyes, Shawn Reyes                  ACCOUNT NO.:  [id number]   MEDICAL RECORD NO.:  [id number]          PATIENT TYPE:  INP   LOCATION:  6531                         FACILITY:  [facility]   PHYSICIAN:  [name], MD, FACCDATE OF BIRTH:  02/26/1921   DATE OF ADMISSION:  09/04/2007  DATE OF DISCHARGE:  09/06/2007                               DISCHARGE SUMMARY   PRIMARY CARDIOLOGIST:  [name], MD, [facility]   PRIMARY CARE PHYSICIAN:  [name], MD   PROCEDURES PERFORMED:  Cardiac catheterization completed by Dr. [name] revealing:  1. Left main coronary artery was short.  It appeared to have a 30-40%      osteal lesion.  There was no catheter dampening.  Proximate LAD was      normal.  Mid LAD had a long area of re-stenosis and a recently      placed stent.  The degree of stenosis was in the 95% range.  This      also includes the takeoff of a first large diagonal branch, which      had an 80% osteal stenosis.  Distal LAD had 30-40% multiple      discrete lesions.  There was a small intermediate branch with 30-      40% discrete lesions.  Circumflex artery primarily consisted of a      large second obtuse marginal branch.  The mid circumflex had 40%      multiple discrete lesions.  Obtuse marginal branch was without      critical disease.  The patient did have left-to-right collaterals      to the distal right coronary artery.  The right coronary artery had      30% discrete disease proximally.  The mid vessel had 70-80%      eccentric lesion.  The vessel was 100% occluded proximal to the      previously placed distal stent.  There was a large artery groove      branch with 80% osteal stenosis.  There was some left-to-right      collaterals to the distal right coronary artery.  Ejection fraction      was found to be 65% with no gradient across the aortic valve.  No      MR.  2. PTCA and drug-alluding stent to the LAD.        LAD with severe diffuse in-stent re-stenosis,  95% to 0% with a  2.75 x 32 taxis post dilatation with 3.0 x 20 quantum MAV to 18  atmospheres x 2, diagonal too occluded after stenting but filled from  left-to-left collaterals in patient with chest pain-free (please see Dr.  Casimiro Needle Cooper's thorough cardiac catheterization PCI dictation for more  details.)   FINAL DISCHARGE DIAGNOSES:  1. Coronary artery disease.      a.     Patient with severe LAD, mid diffuse in-stent re-stenosis.       Also first diagonal with 80% osteal stenosis.  LAD had 30-40%       multiple discrete lesions.  Intermediate branch 30-40%.  Circumflex had mid 40% multiple discrete lesions.  Right coronary       artery 30% discrete disease proximally.  Mid vessel 70-80%       eccentric lesion.  Vessel also had 100% occlusion proximal to the       previously placed distal stent.  There was a large artery groove       branch with 80% osteal stenosis.      b.     LAD mid with severe diffuse in-stent re-stenosis, 95% to 0%       with a taxis drug-alluding stent placed per Dr. [name] on       September 05, 2007.  2. Paroxysmal atrial fibrillation on chronic Coumadin therapy.   SECONDARY DIAGNOSES:  1. Post procedure confusion.        Assessment by Dr. [name] secondary to medication use of  Fentanyl and Valium preprocedure.  1. Diabetes - controlled.  2. Hypertension.  3. Renal insufficiency.  4. Dyslipidemia.   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  The patient  presented with chest discomfort that had been going on for approximately  10 days and was complaining of classic angina symptoms.  The patient was  started on Imdur prior to discussion about cardiac catheterization;  however, the patient did have recurrent chest discomfort.  The patient  did take sublingual nitroglycerin and again had discomfort while walking  to the mailbox after eating.  As  a result of this, the patient was seen  by Dr. [name] in the office and despite the use of Imdur, the  patient had recurrent chest pain.  Therefore, Dr. [name] advised the  patient to have a cardiac catheterization with possible PCI if  necessary.  The patient's Coumadin was held for 4 days prior to the  outpatient procedure.   The patient was admitted and planned for cardiac catheterization as  described above.  Patient's catheterization was completed on September 04, 2007 by Dr. [name] revealing severe mid LAD re-stenosis of a  recently placed stent at 95%.  The films were reviewed with Dr. [name], who followed up with a PCI and taxis drug-alluding stent to the  mid LAD, reducing the stenosis from 95% to 0%.  The patient tolerated  the procedure well.  There was no evidence of hematoma, bleeding,  infection or pain at the catheterization site.   Later that evening around 7 p.m., the patient was re-assessed by Dr.  [name] as he was severely confused.  The patient was evaluated by Dr.  [name] and was found to have no focal defects.  The patient's speech was  clear.  His strength was 5/5 both hands and feet.  Finger-to-nose was  normal.  The patient admitted to feeling high and Dr. [name] suspected  it was secondary to Fentanyl in the cath lab and also Valium  precatheterization.  The patient also was diabetic and had not eaten all  day secondary to his procedure.   The patient the following morning was assessed by myself and Dr. [name].  He still had some mild confusion and repeated thoughts and  verbalization.  [name] did followup with the patient shortly  thereafter and Dr. Fabio Bering note felt that the patient was very lucid  with no focal deficits.  His memory was 3/3.  The patient actually  described election results and  knew who [name] was.  There was some  concern later in the day by the nurses, as he remained slightly confused  and repeated  thoughts and questions.  Family members did notice some  mild confusion as well.  A girlfriend who knows him best states that he  sometimes get into this type of behavior prior to this admission where  he repeats himself and has to be very focused on certain subjects and  speaks compulsively at times.  The patient was able to follow  instructions and was found to be baseline prior to discharge.   DISCHARGE LABS:  Troponin on day of discharge 3.69, CK 357, MB 29.6.  Hemoglobin 12.9, hematocrit 36.8, white blood cells 6.5, platelets 135.  PT 13.8, INR 1.0, sodium 135, potassium 3.7, chloride 101, CO2 24, BUN  13, creatinine 1.2, glucose 113.   VITAL SIGNS:  Blood pressure 130/46, heart rate 72, respirations 19, O2  saturation 95% on room air.   DISCHARGE MEDICATIONS:  1. Aspirin 325 mg daily.  2. Zocor 40 mg daily.  3. Niacin 500 mg at bedtime.  4. Warfarin 4.5 mg daily with adjustment per Coumadin clinic.  5. Fish oil 1000 mg twice a day.  6. Multivitamin daily.  7. Ambien 10 mg as needed for insomnia.  8. Ativan 0.5 mg p.r.n. insomnia.  9. Nitroglycerin 0.4 mg p.r.n. chest pain.  10.Plavix 75 mg daily (new prescription provided.)  11.Coreg 3.125 mg b.i.d. (new prescription provided.)  12.HCTZ daily.   The patient has been advised to stop taking Diovan as previously taken  prior to admission.   FOLLOW UP, PLANS AND APPOINTMENT:  1. The patient will see Dr. [name] on November 24 at 11:30 in      [place].  2. The patient will followup with his primary care physician, Dr.      Dan Humphreys, for continued medical management.  3. The patient has been given post cardiac catheterization      instructions with particular emphasis on the right groin site for      evidence of swelling, hematoma, pain, bleeding or signs of      infection.  4. The patient has been advised on new prescriptions of Plavix and      Coreg and their dosing and advised to stop taking Diovan.   Time spent  with the patient to include physician time - 45 minutes.      Bettey Mare. Lyman Bishop, NP      [name], MD, [facility]  Electronically Signed    KML/MEDQ  D:  09/06/2007  T:  09/06/2007  Job:  [id number]   cc:   Nolon Rod, MD, [facility]

## 2011-03-16 NOTE — Assessment & Plan Note (Signed)
[facility] OFFICE NOTE   Shawn Reyes, Shawn Reyes                           MRN:          [id number]  DATE:11/20/2007                            DOB:          May 15, 1921    PRIMARY:  Dr. [name].   REASON FOR PRESENTATION:  Evaluate the patient for coronary artery  disease.   HISTORY OF PRESENT ILLNESS:  The patient is 77 years old.  He presents  for evaluation of his known coronary artery disease.  He has been doing  relatively well.  He has been having a little bit of pain in his right  foot.  This limits him, particularly in the morning.  However, he does  try to get out and walk slowly.  He says he has been having chest  discomfort and takes about 1 nitroglycerin a week.  This is not similar  to his previous discomfort, which was reproducible throat discomfort  with activity.  He says this happens sporadically.  He does have fatigue  and takes naps during the day.  He does not sleep well at night.  He  denies any shortness of breath.  Has not had any PND or orthopnea.  He  is not describing any palpitations, presyncope, or syncope.   PAST MEDICAL HISTORY:  Coronary artery disease (see the September 25, 2007 note for details).  Paroxysmal atrial fibrillation on chronic  Coumadin therapy.  Sleep apnea on CPAP.  Questionable transient ischemic  attack.  Prostate cancer status post radiation.  Diabetes mellitus.  Hypertension.  Dyslipidemia.  Renal insufficiency.   ALLERGIES:  None.   CURRENT MEDICATIONS:  1. Simvastatin 40 mg daily.  2. Niacin 500 mg nightly.  3. Coumadin.  4. Fish oil 1000 mg b.i.d.  5. Multivitamin.  6. Plavix 75 mg daily.  7. Carvedilol 3.125 mg b.i.d.  8. Hydrochlorothiazide 25 mg daily.  9. Imdur 60 mg daily.   REVIEW OF SYSTEMS:  As stated in the HPI and otherwise negative for  other systems.   PHYSICAL EXAMINATION:  The patient is in no distress.  Blood pressure 116/60, heart  rate is 64 and regular, weight 196 pounds.  HEENT:  Eyelids unremarkable.  Pupils are equal, round, and reactive to  light and accommodation.  Fundi are not visualized.  Oral mucosa  unremarkable.  NECK:  No jugular venous distension at 45 degrees, carotid upstroke  brisk and symmetric, slight bilateral carotid bruits, no thyromegaly.  LYMPHATICS:  No adenopathy.  LUNGS:  Clear to auscultation bilaterally.  BACK:  No costovertebral angle tenderness.  CHEST:  Unremarkable.  HEART:  PMI not displaced or sustained, S1 and S2 within normal limits,  no S3, no S4, no clicks, rubs, murmurs.  ABDOMEN:  Flat, positive bowel sounds, normal in frequency and pitch, no  bruits, rebound, guarding.  No midline pulsatile masses, hepatomegaly,  splenomegaly.  SKIN:  No rashes, no nodules.  EXTREMITIES:  With 2+ pulses upper, 2+ femorals, 1+ dorsalis pedis and  posterior tibialis bilaterally.  No edema.  NEURO:  Grossly intact throughout.  ASSESSMENT AND PLAN:  1. Coronary artery disease.  The patient is having sporadic chest      discomfort.  Not similar to his previous unstable angina.  At this      point, no further cardiovascular testing is suggested.  He is off      of his aspirin, which a current recommendation in patients who have      a need for Plavix and Coumadin.  2. Atrial fibrillation as above.  He has chronic Coumadin therapy.  3. Dyslipidemia.  He is going to get me a copy of his lipid profile,      which was recently checked by his primary care doctor.  4. Followup.  I will see him back in about 6 months or sooner if      needed.     [name], MD, [facility]  Electronically Signed    JH/MedQ  DD: 11/20/2007  DT: 11/20/2007  Job #: [id number]   cc:   [name]

## 2011-03-16 NOTE — Letter (Signed)
April 18, 2007    Shawn Reyes, M.D.  [address]  [place], [place] [address]   RE:  Shawn Reyes  MRN:  [id number]  /  DOB:  [birth year]   Dear Shawn Reyes:   It was a pleasure to see Dr. Marissa Reyes today concerning his coronary  artery disease in the setting of ventricular ectopy.   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 He then had repeat stents placed in 2005.  This  included a Cypher stent to the distal RCA and the proximal PDA. This was  accompanied by enzyme elevation apparently, and there is a comment about  an inferior wall MI.  A subsequent Myoview scan in June 2007  demonstrated an ejection fraction of 41% with inferior wall defect  without ischemia.   Over the last couple of months and progressively over the last couple of  weeks, he has had increasingly notable chest pain when he walked on a  track down at [facility].  It is relieved by rest and nitroglycerin.  He  has had none at rest.   His cardiac risk factors include diabetes and hypertension.   His medical-related history is notable for renal insufficiency.   He also has a history of atrial fibrillation identified by  electrocardiogram in November 2006 for which he takes Coumadin at which  time decision was made to discontinue his Plavix as his drug-eluting  stents were more than a year old.   The patient denies syncope or palpitations.  He has not had any resting  dyspnea, nocturnal dyspnea, or orthopnea.  He does have occasional  peripheral edema.   PAST MEDICAL HISTORY:  In addition to above is notable for:  1. Hypogonadism.  2. Prostate cancer for which he has recently started radiation      therapy.  3. Colonic polyps.  4. BPH.   PAST SURGICAL HISTORY:  Notable for a fundoplication.   SOCIAL HISTORY:  He is divorced with two children.  He does not use  cigarettes.  He does  drink 1-1/2 ounces of beer at night.   MEDICATIONS:  1. Aspirin 81 mg.  2. Coumadin.  3. Diovan 80.  4. Fish oil.  5. Hydrochlorothiazide 25.  6. Niaspan 1000 daily.  7. Vytorin 10/20.   ALLERGIES:  No known drug allergies.   PHYSICAL EXAMINATION:  GENERAL:  He is an elderly Caucasian male,  appearing his stated age of 5.  VITAL SIGNS:  Blood pressure 142/74, pulse 86.  HEENT:  Exam demonstrated no icterus or xanthomata.  NECK:  Neck veins are flat.  Carotids were brisk and full bilaterally.  There was a right greater than left bruit.  There was a transmitted  murmur.  BACK:  Without kyphosis, scoliosis.  LUNGS:  Clear.  CARDIAC:  Heart sounds were regular.  There was an S4 and an early  systolic murmur heard along the right upper sternal border.  ABDOMEN:  Soft with active bowel sounds without midline pulsation or  hepatomegaly.  EXTREMITIES:  Femoral pulses were 2+.  Distal pulses were intact.  There  was no clubbing, cyanosis, or edema.  NEUROLOGIC:  Exam was grossly normal.  SKIN:  Warm and dry.   Electrocardiogram dated today demonstrated sinus rhythm at 86 with  intervals of 0.21/0.09/0.39.  The axis was leftward at -22.  There was  frequent  ventricular ectopy that had a right bundle superior axis  morphology.   IMPRESSION:  1. Coronary artery disease.      a.     Prior angioplasty remotely.      b.     Prior drug-eluting stents at noted above with Cypher stents       in 2005.      c.     Recent crescendo angina.      d.     Ejection fraction of 40% or so.  2. Frequent premature ventricular contractions for years.  3. Paroxysmal atrial fibrillation on Coumadin.  4. Intolerance to BETA BLOCKERS despite multiple attempts.  5. Renal insufficiency, question severity and question need for      prehydration prior to catheterization.  6. Prostate cancer on radiation therapy.  7. Obstructive sleep apnea with CPAP.  8. Chronic fatigue.  9. Borderline depression.   10.Hypogonadism.   Shawn Reyes, Shawn Reyes has significant coronary artery disease and recurrent  angina.  I think Myoview testing is likely not going to give [place] the  information that we need given his symptoms and pretest probability of  heart disease.  I would suggest that we contemplate catheterization, and  the main issue would be renal insufficiency and if this would be  precluding.   I will need to wait until his creatinine ebbs.  I have tentatively  scheduled him for a week from Thursday.  In the event the creatinine is  elevated, we may need to bring him in as appropriate for prehydration.  We will plan to hold his Coumadin for about 5 days.   Thank you very much for asking [place] to see him.    Sincerely,      Duke Salvia, MD, [facility]  Electronically Signed    SCK/MedQ  DD: 04/18/2007  DT: 04/18/2007  Job #: [id number]   CC:    Jaquelyn Bitter, MD, [place], [place]

## 2011-03-16 NOTE — Assessment & Plan Note (Signed)
[facility] OFFICE NOTE   Shawn Reyes, Shawn Reyes                           MRN:          [id number]  DATE:09/25/2007                            DOB:          Jul 18, 1921    PRIMARY CARE PHYSICIAN:  Dr. [name].   REASON FOR PRESENTATION:  Evaluate patient status post recent stenting  of his left anterior descending.   HISTORY OF PRESENT ILLNESS:  The patient presents for followup after  having new-onset unstable angina.  He had a cardiac catheterization  which demonstrated left anterior descending.  He had a long restenotic  area of 95% and a previously placed non drug-eluting stent.  The distal  left anterior descending had 30% to 40% stenosis, small intermediate had  30% to 40% stenosis, circumflex had 40% stenosis in an obtuse marginal  branch.  Right coronary artery had 30% stenosis followed by 70% to 80%  stenosis, and was followed by an occlusion.  There were left to right  collaterals.  The ejection fraction was 65%.  The patient had stenting  using a Taxus drug-eluting stent reducing the 95% left anterior  descending lesion to zero.  The patient had some post procedure  confusion which cleared and was thought to be, probably, some delirium.  He had some elevated blood sugars and required a couple of doses of  insulin during his hospitalization.   Since going home he has had none of the symptoms that prompted his  previous evaluation.  He did have some burning discomfort that was  somewhat different.  He took a nitroglycerin at least once.  However, he  has been able to walk at night and not get the discomfort that was  bothering him previously.  He has had no problems with his leg.  He has  had no palpitations, presyncope, or syncope.  He has had no PND or  orthopnea.   PAST MEDICAL HISTORY:  1. Coronary artery disease (see above, his catheterization in 2006, at      the time he had a non drug-eluting  stent to his left anterior      descending).  2. Paroxysmal atrial fibrillation on chronic Coumadin therapy.  3. Questionable transient ischemic attacks.  4. Prostate cancer, status post radiation.  5. Diabetes mellitus.  6. Hypertension.  7. Dyslipidemia.  8. Renal insufficiency.   ALLERGIES:  None.   MEDICATIONS:  1. Aspirin 325 mg daily.  2. Simvastatin 40 mg daily.  3. Niacin 500 mg nightly.  4. Coumadin.  5. Fish oil.  6. Multivitamin.  7. Plavix 75 mg daily.  8. Coreg 3.125 mg b.i.d. (actually, the patient has been taking half      of a 3.125 in the morning as he felt fatigued).  9. Hydrochlorothiazide 25 mg daily.   REVIEW OF SYSTEMS:  As stated in the HPI and otherwise negative for  other systems.   PHYSICAL EXAMINATION:  Patient is in no distress.  Blood pressure 128/60, heart rate 66 and regular.  HEENT:  Eyelids unremarkable, pupils are equal, round, and reactive  to  light, fundi not visualized.  Oral mucosa unremarkable.  NECK:  No jugular venous distension at 45 degrees, carotid upstroke  brisk and symmetrical.  No bruit.  No thyromegaly.  LYMPHATICS:  No adenopathy.  LUNGS:  Clear to auscultation bilaterally.  BACK:  No costovertebral angle tenderness.  CHEST:  Unremarkable.  HEART:  PMI not displaced or sustained.  S1 and S2 are within normal  limits.  No S3, no S4.  No clicks, no rubs, no murmurs.  ABDOMEN:  Flat, positive bowel sounds, normal in frequency and pitch.  No bruits, no rebound, no guarding.  No midline pulsatile mass.  No  organomegaly.  SKIN:  No rashes, no nodules.  EXTREMITIES:  2+ upper pulses, 2+ femorals, 1+ dorsalis pedis and post  tibialis bilaterally.  No edema.  NEURO:  Grossly intact.   EKG sinus rhythm, leftward axis, 1st degree AV block, no acute ST-T wave  changes.   ASSESSMENT AND PLAN:  1. Coronary disease:  The patient is now status post stenting with a      drug-eluting stent.  The difficult issue here is the  combination of      aspirin, Coumadin, and Plavix.  There have been multiple reports on      how this is managed.  There is no consensus document or opinion,      however.  At this point he will continue all 3 medications, though      I will reduce his aspirin to 81 mg daily.  I will see him back in a      couple of months.  I will not be continuing the Plavix through the      full 18-month course.  He knows to let me know if he has any      bleeding issues.  We discussed the risk of this triple therapy.  2. Dyslipidemia:  At the last visit per his request, I switched him      from Vytorin to simvastatin.  When he comes back in a couple of      months he can have a lipid profile if he has not already had one      done in the interim by Dr. Dan Humphreys, and the goal should be an LDL      less than 70 and      HDL greater than 40.  3. Followup:  I will see him back in 2 months or sooner if needed.     [name], MD, [facility]  Electronically Signed    JH/MedQ  DD: 09/25/2007  DT: 09/25/2007  Job #: [id number]   cc:   [name]

## 2011-03-16 NOTE — Assessment & Plan Note (Signed)
[facility] OFFICE NOTE   Shawn Reyes, Shawn Reyes                           MRN:          [id number]  DATE:05/10/2008                            DOB:          Mar 03, 1921    PRIMARY CARE PHYSICIAN:  Dr. [name].   REASON FOR PRESENTATION:  Evaluate the patient for coronary artery  disease.   HISTORY OF PRESENT ILLNESS:  The patient is 77 year old.  He presents  for evaluation of his known coronary artery disease.  He has been doing  well since I last saw him.  He has been doing some walking and little  exercises.  He has not been having any chest pressure, neck or arm  discomfort.  He has not been having any palpitations, presyncope, or  syncope.  He has not required any nitroglycerin.  He has had no  shortness of breath.  He denies any PND or orthopnea.   PAST MEDICAL HISTORY:  1. Coronary artery disease (see the September 25, 2007, note for      details).  2. Paroxysmal atrial fibrillation, on chronic Coumadin therapy.  3. Sleep apnea, on CPAP.  4. Questionable transient ischemic attack.  5. Prostate cancer, status post radiation therapy.  6. Diabetes mellitus.  7. Hypertension.  8. Dyslipidemia.  9. Renal insufficiency.   ALLERGIES:  None.   MEDICATIONS:  1. Zocor 40 mg daily.  2. Warfarin.  3. Fish oil.  4. Multivitamin.  5. Plavix 75 mg daily.  6. Coreg 3.125 mg b.i.d.  7. Hydrochlorothiazide 25 mg daily.  8. Imdur 60 mg daily.  9. Lexapro 7.5 mg daily.   REVIEW OF SYSTEMS:  As stated in the HPI and otherwise negative for  other systems.   PHYSICAL EXAMINATION:  GENERAL:  The patient is in no distress.  VITAL SIGNS:  Blood pressure 104/60, heart rate 67 and regular, and  weight 199 pounds.  HEENT:  Eyes are unremarkable.  Pupils are equal, round, and reactive to  light.  Fundi not visualized.  Oral mucosa unremarkable.  NECK:  No jugular venous distention at 45 degrees.  Carotid upstroke  brisk and symmetrical.  No bruits.  No thyromegaly.  LYMPHATICS:  No adenopathy.  LUNGS:  Clear to auscultation bilaterally.  BACK:  No costovertebral angle tenderness.  CHEST:  Unremarkable  HEART:  PMI not displaced or sustained.  S1 and S2 within normal limits.  No S3.  No S4.  No clicks.  No rubs.  No murmurs.  ABDOMEN:  Flat.  Positive bowel sounds normal in frequency and pitch.  No bruits.  No rebound.  No guarding.  No midline pulsatile mass.  No  hepatomegaly.  SKIN:  No rashes.  No nodules.  EXTREMITIES:  2+ upper pulse, 2+ femoralis, 1+ dorsalis pedis and  posterior tibialis bilaterally.  No edema.  No cyanosis.  No clubbing.  NEURO:  Oriented to person, place, and time.  Cranial nerves II-XII  grossly intact.  Motor grossly intact.   EKG:  Sinus rhythm, rate 67, leftward axis, first-degree AV block, no  acute  ST-T wave changes.   ASSESSMENT/PLAN:  1. Coronary artery disease.  The patient is having no new symptoms.      No further cardiovascular thing is suggested.  He is going to      remain on the combination of Plavix and Coumadin for now.  I am      going to consider discontinuing the Plavix in November 2009 when it      has been a year since his Taxus stent was placed.  2. Atrial fibrillation.  He is on chronic Coumadin therapy.  He has      not had any symptomatic paroxysms of this.  3. Dyslipidemia.  He is followed by his primary care doctor.  The goal      of LDL less than 100 and HDL greater than 40.  4. Followup.  I would like to see him back in November 2009 to discuss      the Plavix.  For now, he will remain off aspirin, but will be on      the Coumadin as above.     [name], MD, [facility]  Electronically Signed    JH/MedQ  DD: 05/10/2008  DT: 05/11/2008  Job #: [id number]   cc:   [name]

## 2011-03-16 NOTE — Discharge Summary (Signed)
NAME:  Shawn Reyes, BROYLES NO.:  [id number]   MEDICAL RECORD NO.:  [id number]          PATIENT TYPE:  OIB   LOCATION:  6525                         FACILITY:  [facility]   PHYSICIAN:  Veverly Fells. [name], MD  DATE OF BIRTH:  [birth year]   DATE OF ADMISSION:  04/27/2007  DATE OF DISCHARGE:  04/28/2007                         DISCHARGE SUMMARY - REFERRING   DISCHARGE DIAGNOSIS:  1. Unstable angina.  2. Progressive coronary artery disease.  3. Status post bare metal stent to the mid left anterior descending.  4. Thrombocytopenia.   HISTORY OF PROCEDURES PERFORMED:  Cardiac catheterization with bare  metal stenting to the mid left anterior descending by Dr. [name] on April 27, 2007.   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  He has not had any rest or nocturnal  episodes.   PAST MEDICAL HISTORY:  His past medical history is notable for coronary  artery disease with intervention in the 1980's at [facility], repeat stents in  2005 including a CYPHER stent to distal RCA and proximal PDA.  Last  Myoview in June 2007 showed EF 41% with inferior wall defect without  ischemia.  His history is also notable for diabetes, hypertension, renal  insufficiency, atrial fibrillation for which he is chronically  anticoagulated, hypogonadism, prostate cancer with radiation therapy,  colon polyps, BPH, status post fundoplication.   LABORATORY DATE:  On April 27, 2007, PTT was 26, PT 13.9, sodium 135,  potassium 3.4, BUN 23, creatinine 1.37, glucose 155.  Post procedure CK-  MB was 138 and 5.9, troponin 0.09.  On April 28, 2007, at the time of  discharge, H&H were 15.5 and 44.8, normal indices, platelets 96,000,  WBCs 7600.  Prior to discharge, sodium was 135, potassium 3.9, BUN 23,  creatinine 1.36, glucose 129.  (Prior to admission, it was noted that  his platelets were  139,000).  EKG postprocedure showed normal sinus  rhythm, Baseline artifact, left axis deviation, left anterior hemi  block, PVCs, nonspecific changes.   HOSPITAL COURSE:  Mr. [name] was brought in for cardiac catheterization  after he was seen by Dr. Graciela Husbands in the [place] office.  Cardiac  catheterization performed initially on April 27, 2007, showed a chronic  RCA occlusion, 75% diagonal I, left-to-right collaterals and a 99% LAD  lesion.  Dr. [name] on review performed bare metal stenting to the mid-  LAD without difficulty.  After sheath removal and bedrest, the patient  was ambulating without difficulty.  Catheterization site was intact.  It  was noted post procedure platelets had dropped slightly compared to  preadmission.  Dr. [name] felt that Dr. Earlene Plater could be discharged home.   DISPOSITION:  1. The patient is discharged home.  2. Activities and wound care are per supplemental discharge sheet.  3. He is asked to maintain a low sodium, Heart Healthy diet.  4. Bring all medications to all appointments.  5. New medications include:      a.  Plavix 75 mg for 30 days.      b.     Nitroglycerin 0.4 as needed.      c.     He is asked to continue aspirin 81 mg daily.      d.     Resume his Coumadin as previously, which was believed to be       4.5 mg daily.      e.     Diovan 80 mg daily.      f.     Fish oil 1000 mg b.i.d.      g.     Hydrochlorothiazide 25 mg daily.      h.     Niaspan 1000 mg daily.      i.     Vytorin 10/20 mg daily.  6. He is asked to arrange a PT/INR next week, giving Coumadin      reinitiation.  7. He is also asked to go by Dr. Odessa Fleming office for a CBC to follow up      on thrombocytopenia on May 01, 2007.   DISCHARGE TIME:  35 minutes.      Joellyn Rued, PA-C      Veverly Fells. [name], MD  Electronically Signed    EW/MEDQ  D:  04/28/2007  T:  04/28/2007  Job:  [id number]   cc:   Duke Salvia, MD, [facility]  [name]

## 2011-03-16 NOTE — Letter (Signed)
May 10, 2007    Shawn Reyes  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  As you  recall, he received a 2.75 bare metal stent for which Plavix will be  prescribed for a total of a month ending about July 25.   He has been walking.  He has had no recurrent chest discomfort.  No  shortness of breath.  He has had the problems with his shingles and his  radiation therapy.   He is tolerating an ARB at this point, previously not having tolerated  ACE inhibitors.  He does not tolerate beta blockers.  His sleep remains  disturbed, but it seems to be improving.   Atrial fibrillation is quiescent.   MEDICATIONS:  1. Aspirin 81.  2. Plavix 75.  3. Niaspan 500 down from 1000 because of flushing.  4. Hydrochlorothiazide.  5. Vytorin.   EXAMINATION:  His blood pressure was 140/70, pulse 63.  LUNGS:  Clear.  HEART:  Sounds are regular.  EXTREMITIES:  Without edema.   ELECTROCARDIOGRAM:  Dated today demonstrated sinus rhythm at 63 with  intervals of 0.32/0.09/0.42.  The axis was -22.   T waves were flat in the lateral leads.   IMPRESSION:  1. Ischemic heart disease with:      a.     Prior myocardial infarction.      b.     Bare metal stenting of his left anterior descending with       resolution of his chest pain.      c.     Ejection fraction of 41%.  2. Prostate cancer with radiation therapy.  3. Atrial fibrillation for which he is chronically anticoagulated.  4. Diabetes.  5. Hypertension.   Dr. Earlene Plater is doing really quite well.  I suggested that his follow up  with you could continue quarterly.  I will see him in 6 months and then  you and he can determine how often followup with me would be helpful to  your care of him.   Thanks for allowing [place] to participate in his care.    Sincerely,      Duke Salvia, MD,  [facility]  Electronically Signed    SCK/MedQ  DD: 05/10/2007  DT: 05/10/2007  Job #: [id number]

## 2011-03-16 NOTE — Cardiovascular Report (Signed)
NAME:  Shawn Reyes, Shawn Reyes NO.:  [id number]   MEDICAL RECORD NO.:  [id number]          PATIENT TYPE:  OIB   LOCATION:  6525                         FACILITY:  [facility]   PHYSICIAN:  Veverly Fells. [name], MD  DATE OF BIRTH:  Mar 09, 1921   DATE OF PROCEDURE:  04/27/2007  DATE OF DISCHARGE:                            CARDIAC CATHETERIZATION   PROCEDURES:  1. Left heart catheterization.  2. Selective coronary angiography.  3. PTCA and stenting of the LAD.  4. Angio-Seal the right femoral artery.   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  He has known coronary artery  disease and has been treated in the past with stenting of his right  coronary artery.  He has had a inferior wall myocardial infarction and  has had a fixed defect in his inferior wall on prior nuclear imaging.  His last nuclear study was approximately one year ago but with his  presentation of very typical symptoms, he was referred for  catheterization to rule out new obstructive coronary artery disease.   Risks and indications of procedure were explained and informed consent  was obtained.  The right groin was prepped, draped and anesthetized with  1% lidocaine.  Using modified Seldinger technique a 6-French sheath was  placed in the right femoral artery.  Multiple views of the left and  right coronary arteries were taken using standard Judkins preformed  catheters.  Following selective coronary angiography, an angled pigtail  catheter was inserted into the left ventricle and pressures were  recorded.  A left ventriculogram was deferred as the patient has renal  insufficiency.  A pullback across the aortic valve was done.   At the conclusion of the procedure, I elected to proceed with  intervention on the LAD.  The LAD was a large vessel proximally that was  subtotally occluded in its mid portion.  There was a 99% stenosis and an  underfilled mid and distal LAD beyond the area of stenosis.  There was a  medium-size diagonal branch arising just proximal to the area of  stenosis with a 75% ostial lesion as well.  The patient's right coronary  artery was occluded with left-to-right collaterals and I suspected this  was chronic.  Angiomax was used for anticoagulation.  He was preloaded  with 600 mg of clopidogrel on the table.  Once a therapeutic ACT was  achieved, a 6-French XB LAD 3.5 mm guide catheter was inserted, a cougar  guidewire was initially attempted but I was unable to cross the lesion.  I have switched out to a PT light support wire that easily crossed the  lesion and elected to predilate the vessel with a 2.5 x 15 mm balloon  which was dilated to 8 atmospheres.  Following predilatation, the lesion  was expanded and the LAD was much better filled.  I elected to stent the  vessel with a 2.75 x 28 mm Multilink Vision bare metal stent.  This was  deployed at 16 atmospheres.  The stent was well expanded and appeared  to  be appropriately sized for the vessel.  I elected to post dilate the  stent with a 3 mm noncompliant balloon.  The DuraStar balloon was used.  The balloon was inflated to 18 atmospheres on serial inflations.  Following post dilatation, the stent was very well expanded.  There was  TIMI-3 flow throughout the vessel.  The diagonal branch had an ostial  stenosis but there was TIMI-3 flow and I elected to leave that alone in  the setting of TIMI-3 flow.  Final angiographic images were taken and an  AngioSeal was then used for femoral arterial closure.  The patient  tolerated the entire procedure well.  There were no immediate  complications.   FINDINGS:  Aortic pressure 162/64 with a mean of 107, left ventricular  pressure is 165/27.   The left mainstem has luminal irregularities.  There is a 20-30% ostial  stenosis with minor pressure damping.  The left main bifurcates into the  LAD and left  circumflex.  The LAD is a large-caliber vessel in its  proximal portion.  There is mild calcification present.  There are minor  luminal irregularities proximally.  Just beyond the first diagonal  branch, the LAD has severe stenosis with a 99% lesion present.  The mid  and distal LAD appear underfilled beyond the area of stenosis.  The flow  in that region is TIMI-2.  The first diagonal branch that arises just  proximal to the stenosis has a 75-80% stenosis.  There is TIMI-3 flow in  the diagonal branch.   The left circumflex is a large-caliber vessel.  It courses down and  supplies a large second OM branch.  There is a small intermediate branch  present as well.  The circumflex has luminal irregularities but no  significant stenoses.  The AV groove circumflex is very small.   The right coronary artery has diffuse lesions in the proximal and mid  portion.  Proximally, there is a 70% focal stenosis in the mid portion.  There is a 90% stenosis leading into the stent which is in the distal  right coronary artery.  The vessel is occluded in the proximal portion  of the stent.  The right PDA fills via left-to-right collaterals.   ASSESSMENT:  1. Two-vessel coronary artery disease with a chronic right coronary      artery occlusion and severe left anterior descending stenosis.  2. Successful percutaneous coronary intervention of the left anterior      descending with a 2.75 x 28 mm Vision stent.  3. Nonobstructive left circumflex disease.   PLAN:  Recommend aspirin and clopidogrel for 30 days.  Will resume the  patient's Coumadin.  I elected to place a bare metal stent as I was  concerned about long-term anticoagulation, along with dual antiplatelet  therapy in this 77 year old gentleman.  He should receive aspirin 81 mg,  clopidogrel 75 mg and his home dose of Coumadin.  His clopidogrel can be  discontinued after one month.  Will continue medical therapy for his  coronary artery disease  as well.      Veverly Fells. [name], MD  Electronically Signed     MDC/MEDQ  D:  04/28/2007  T:  04/28/2007  Job:  [id number]   cc:   Duke Salvia, MD, [facility]

## 2011-03-19 NOTE — Cardiovascular Report (Signed)
[name], [name] NO.:  [id number]   MEDICAL RECORD NO.:  [id number]          PATIENT TYPE:  INP   LOCATION:  6531                         FACILITY:  [facility]   PHYSICIAN:  Shawn Fells. [name], MD  DATE OF BIRTH:  18-May-1921   DATE OF PROCEDURE:  08/05/2007  DATE OF DISCHARGE:                            CARDIAC CATHETERIZATION   PROCEDURE:  Percutaneous transluminal coronary angioplasty and stenting  of the mid left anterior descending artery, star close of the right  femoral artery.   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  He presented this summer with unstable angina and underwent bare  metal stenting of his mid LAD.  A bare metal stent was chosen because of  need for chronic anticoagulation in the setting of atrial fibrillation.  He presented with recurrent severe angina and underwent a diagnostic  catheterization yesterday.  This demonstrated severe diffuse in-stent  restenosis through the stented segment of the mid LAD.  There is also a  moderate-sized diagonal branch arising from this area that is subtotally  occluded.  We reviewed the best approach for revascularization and  elected to treat him with a drug-eluting stent for his severe end-stent  restenosis.   The risks and indications of the procedure were reviewed with the  patient in detail.  Informed consent was obtained.  The left groin was  prepped and draped and anesthetized with 1% lidocaine.  Using a modified  Seldinger technique, a 6 French arterial sheath was placed in the left  common femoral artery.  A 6 French XB 3.5 cm guide catheter was  inserted, and initial angiographic images were taken.  Angiomax was used  for anticoagulation.  The patient had been preloaded with clopidogrel.  Once the therapeutic ACT was achieved, a Cougar guidewire was passed  easily into the distal LAD beyond the area of severe stenosis.  The  stented segmented was  predilated multiple times with a 2.5 x 20 mm  Maverick balloon, up to 8 and 10 atmospheres.  Following pre-dilatation,  there was some improvement in the restenosis, but there was still  significant stenosis present.  I elected to stent the vessel with a 2.75  x 32 mm Taxus stent, which was deployed at 16 atmospheres.  The stent  was well expanded.  Following stenting, there was TIMI III flow  throughout the LAD.  The diagonal branch was completely occluded after  stenting.  I tried to reopen the diagonal with a whisper wire.  I  attempted for several minutes but was unable to recannulize the vessel.  I elected to go ahead and post dilate the stent with a 3 x 20 mm  Quantuum Maverick balloon.  The entire stented segment was treated and  dilated up to 20 atmospheres on two inflations.  Following post  dilatation, there was excellent stent expansion with TIMI III flow  throughout the mid LAD.  Final angiography demonstrated a widely patent  LAD with TIMI III flow and the diagonal filling from left to left  collaterals.  The patient  was painfree, and I thought he had received  the maximum benefit from the intervention at that point.  The guide  catheter was removed, and a star close device was used to seal the left  femoral arteriorotomy.  He tolerated the procedure well and had no  immediate complications.   ASSESSMENT:  Successful percutaneous intervention with drug-eluting  stent placement in the mid left anterior descending artery to treat  severe diffuse in-stent restenosis.   Recommend 12 months of dual-antiplatelet therapy with aspirin and  clopidogrel.  Would favor an 81 mg aspirin in the setting of  anticoagulation with Coumadin.  Will resume Coumadin tonight.  Plan on  discharge home in the next 24-48 hours.      Shawn Fells. [name], MD  Electronically Signed     MDC/MEDQ  D:  09/05/2007  T:  09/05/2007  Job:  [id number]   cc:   [name], MD, [facility]

## 2011-04-20 ENCOUNTER — Encounter: Payer: Self-pay | Admitting: Cardiology

## 2011-06-01 ENCOUNTER — Other Ambulatory Visit: Payer: Self-pay | Admitting: Cardiology

## 2011-07-06 ENCOUNTER — Encounter: Payer: Self-pay | Admitting: Cardiology

## 2011-08-10 LAB — BASIC METABOLIC PANEL
BUN: 13
BUN: 15
BUN: 17
CO2: 29
Calcium: 9
Chloride: 101
Creatinine, Ser: 1.08
Creatinine, Ser: 1.14
Creatinine, Ser: 1.23
GFR calc Af Amer: >60
GFR calc Af Amer: >60
GFR calc Af Amer: >60
GFR calc non Af Amer: 56 — ABNORMAL LOW
GFR calc non Af Amer: >60
Potassium: 3.5

## 2011-08-10 LAB — CBC
MCV: 96.1
Platelets: 124 — ABNORMAL LOW
Platelets: 135 — ABNORMAL LOW
RBC: 3.77 — ABNORMAL LOW
RDW: 12.5
WBC: 5.3
WBC: 6.5

## 2011-08-10 LAB — PROTIME-INR: INR: 1

## 2011-08-10 LAB — CK TOTAL AND CKMB (NOT AT ARMC)
CK, MB: 29.6 — ABNORMAL HIGH
Total CK: 357 — ABNORMAL HIGH

## 2011-08-10 LAB — HEPARIN LEVEL (UNFRACTIONATED)
Heparin Unfractionated: 0.36
Heparin Unfractionated: 0.9 — ABNORMAL HIGH
Heparin Unfractionated: 1.05 — ABNORMAL HIGH

## 2011-08-18 LAB — BASIC METABOLIC PANEL
BUN: 23
BUN: 23
Calcium: 9.2
Chloride: 100
Chloride: 100
Creatinine, Ser: 1.36
GFR calc Af Amer: >60
GFR calc Af Amer: >60
GFR calc non Af Amer: 50 — ABNORMAL LOW
GFR calc non Af Amer: 50 — ABNORMAL LOW
Potassium: 3.4 — ABNORMAL LOW
Sodium: 135

## 2011-08-18 LAB — CBC
MCV: 94.9
Platelets: 96 — ABNORMAL LOW
RBC: 4.73
WBC: 7.6

## 2011-08-18 LAB — APTT: aPTT: 26

## 2011-08-18 LAB — CK TOTAL AND CKMB (NOT AT ARMC)
CK, MB: 5.9 — ABNORMAL HIGH
Relative Index: 4.3 — ABNORMAL HIGH
Total CK: 138

## 2011-08-18 LAB — TROPONIN I: Troponin I: 0.09 — ABNORMAL HIGH

## 2011-08-18 LAB — PROTIME-INR: INR: 1.1

## 2011-10-22 ENCOUNTER — Ambulatory Visit (INDEPENDENT_AMBULATORY_CARE_PROVIDER_SITE_OTHER): Payer: PRIVATE HEALTH INSURANCE | Admitting: Cardiology

## 2011-10-22 ENCOUNTER — Encounter: Payer: Self-pay | Admitting: Cardiology

## 2011-10-22 VITALS — BP 139/68 | HR 65 | Wt 203.0 lb

## 2011-10-22 DIAGNOSIS — I4891 Unspecified atrial fibrillation: Secondary | ICD-10-CM

## 2011-10-22 NOTE — Assessment & Plan Note (Signed)
His last creatinine was 1.3 and stable.

## 2011-10-22 NOTE — Progress Notes (Signed)
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 Since I last saw him he has done well. He does get occasional chest discomfort and has taken about 3 nitroglycerin in the last 12 months. This is a stable pattern. He denies any resting symptoms. He has no new shortness of breath, PND or orthopnea. He has no palpitations, presyncope or syncope. He's gained a little weight and he is dieting. He walks for exercise.  No Known Allergies  Current Outpatient Prescriptions  Medication Sig Dispense Refill  . amLODipine (NORVASC) 2.5 MG tablet Take 2.5 mg by mouth daily.        Marland Kitchen aspirin 81 MG tablet Take 81 mg by mouth daily.        . carvedilol (COREG) 3.125 MG tablet Take 3.125 mg by mouth 2 (two) times daily with a meal.        . Fish Oil OIL Take 1 tablet by mouth daily.        . hydrochlorothiazide 25 MG tablet Take 25 mg by mouth daily.        . isosorbide mononitrate (IMDUR) 60 MG 24 hr tablet take 1 tablet by mouth once daily  90 tablet  2  . Multiple Vitamin (MULTIVITAMIN) tablet Take 1 tablet by mouth daily.        . niacin (SLO-NIACIN) 500 MG tablet Take 1,500 mg by mouth daily.        . nitroGLYCERIN (NITROSTAT) 0.4 MG SL tablet Place 0.4 mg under the tongue every 5 (five) minutes as needed.        . Nutritional Supplements (MELATONIN PO) Take 1.5 mg by mouth daily.        . simvastatin (ZOCOR) 40 MG tablet Take 40 mg by mouth at bedtime.        Marland Kitchen testosterone cypionate (DEPOTESTOTERONE CYPIONATE) 200 MG/ML injection As directed       . WARFARIN SODIUM PO as directed.          Past Medical History  Diagnosis Date  . CAD (coronary artery disease)     Catheterization in November 2008. He had a long restenotic area, 95% stenosis in the previously placed LAD stent. The distal LAD had 30-40% stenosis. Intermediate had 30-40% stenosis. The circumflex had 40% stenosis in  obtuse marginal. The right coronary artery had 30% followed by 70-8-% stenosis followed by total  occlusion. There were left to right collaterals. He had stenting with a Taxus   . CAD (coronary artery disease)     stent in the LAD. The Ef was 65%  . Paroxysmal atrial fibrillation     On chronic Coumadin Therapy  . Transient ischemic attack     Questionable  . Sleep apnea   . Prostate cancer     Post radiation  . Diabetes mellitus   . Hypertension   . Dyslipidemia   . Renal insufficiency     chronic  . TIA (transient ischemic attack)     Past Surgical History  Procedure Date  . Esophagogastric fundoplication   . Carotid stent Feb. 2011    Right     ROS:  As stated in the HPI and negative for all other systems.  PHYSICAL EXAM BP 139/68  Pulse 65  Wt 203 lb (92.08 kg) GENERAL:  Well appearing HEENT:  Pupils equal round and reactive, fundi not visualized, oral mucosa unremarkable NECK:  No jugular venous distention, waveform within normal limits, carotid upstroke brisk and symmetric, no bruits, no thyromegaly  LYMPHATICS:  No cervical, inguinal adenopathy LUNGS:  Clear to auscultation bilaterally BACK:  No CVA tenderness CHEST:  Unremarkable HEART:  PMI not displaced or sustained,S1 and S2 within normal limits, no S3, no S4, no clicks, no rubs, no murmurs ABD:  Flat, positive bowel sounds normal in frequency in pitch, no bruits, no rebound, no guarding, no midline pulsatile mass, no hepatomegaly, no splenomegaly EXT:  2 plus pulses throughout, no edema, no cyanosis no clubbing SKIN:  No rashes no nodules NEURO:  Cranial nerves II through XII grossly intact, motor grossly intact throughout PSYCH:  Cognitively intact, oriented to person place and time  EKG:   Sinus rhythm, rate 65, old inferior infarct, early transition in lead V2, left axis deviation, no acute ST-T wave changes.  10/22/2011   ASSESSMENT AND PLAN

## 2011-10-22 NOTE — Assessment & Plan Note (Signed)
His carotid stenosis is followed by his vascular surgeon. He will continue with risk reduction.

## 2011-10-22 NOTE — Assessment & Plan Note (Signed)
The patient has no new sypmtoms.  No further cardiovascular testing is indicated.  We will continue with aggressive risk reduction and meds as listed.  

## 2011-10-22 NOTE — Assessment & Plan Note (Signed)
He has had no symptomatic paroxysms of this. He tolerates anticoagulation. No change in therapy is indicated. 

## 2011-10-22 NOTE — Assessment & Plan Note (Signed)
The blood pressure is at target. No change in medications is indicated. We will continue with therapeutic lifestyle changes (TLC).  

## 2011-10-22 NOTE — Patient Instructions (Signed)
The current medical regimen is effective;  continue present plan and medications.  Follow up in 6 months with Dr Hochrein.  You will receive a letter in the mail 2 months before you are due.  Please call us when you receive this letter to schedule your follow up appointment.  

## 2011-10-22 NOTE — Assessment & Plan Note (Signed)
I reviewed his lipids done in September and they were excellent. His HDL runs low but I would not change therapy specifically for this. This will continue to be monitored by Elmo Putt, MD

## 2011-11-15 ENCOUNTER — Encounter: Payer: Self-pay | Admitting: Internal Medicine

## 2011-12-09 ENCOUNTER — Encounter: Payer: Self-pay | Admitting: Internal Medicine

## 2011-12-11 IMAGING — CT CT HEAD WITHOUT CONTRAST
2 series · 15 of 30 positions shown, 19 images · non-contrast
Comparison: none

REASON FOR EXAM: left sided numbness/weakness
COMMENTS:   May transport without cardiac monitor

[Series 4: without · axial · non-contrast · 0.40mm/px · z∈[+262,+408]mm · 13 of 35 slices shown, 17 images]
[im 3/35  brain]
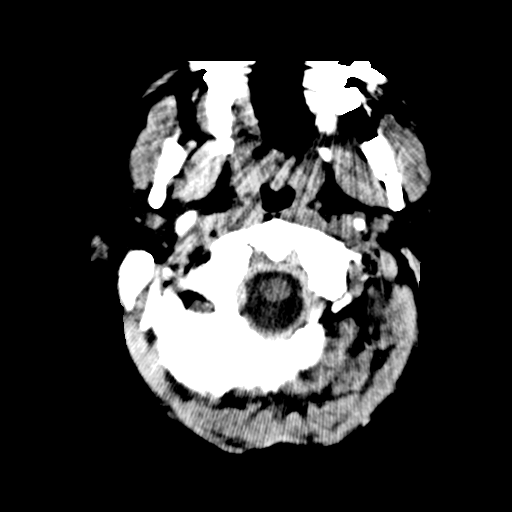
[im 3/35  bone]
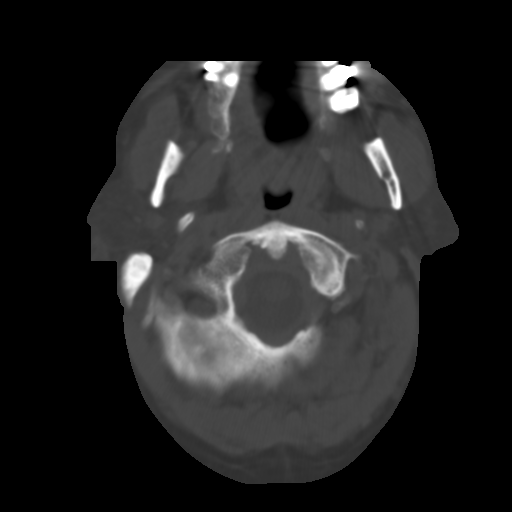
[im 5/35  brain]
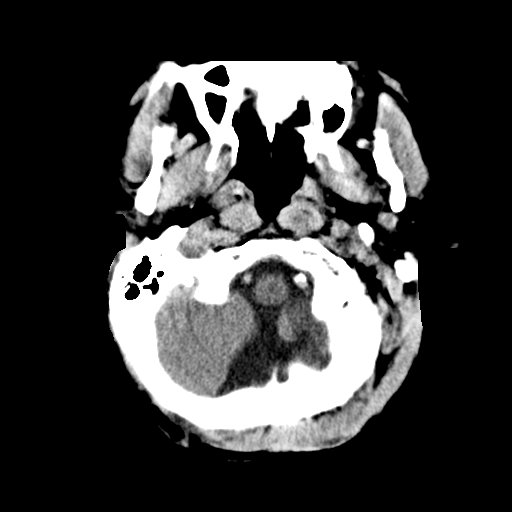
[im 8/35  brain]
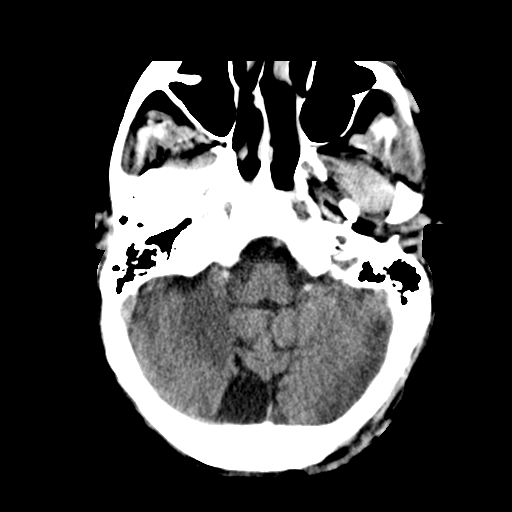
[im 10/35  brain]
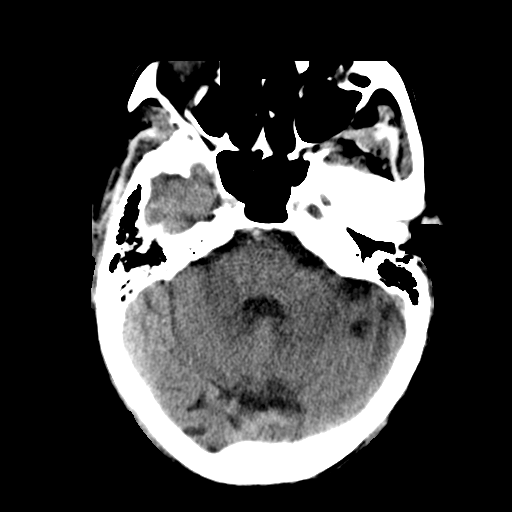
[im 13/35  brain]
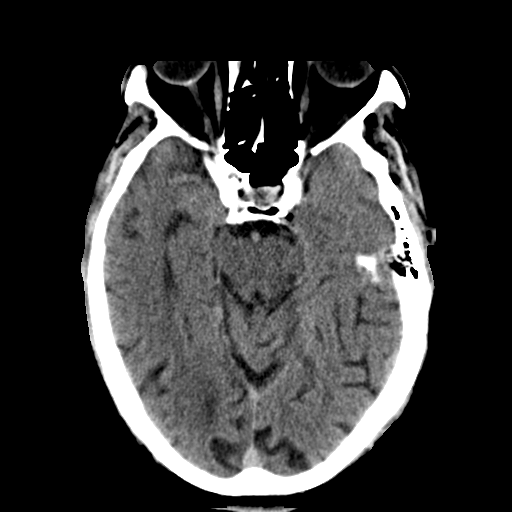
[im 13/35  bone]
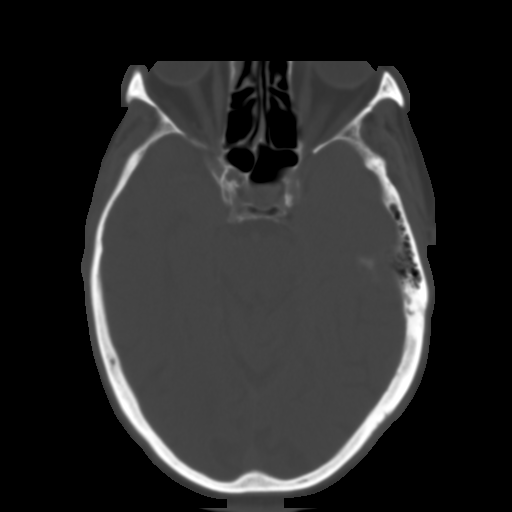
[im 15/35  brain]
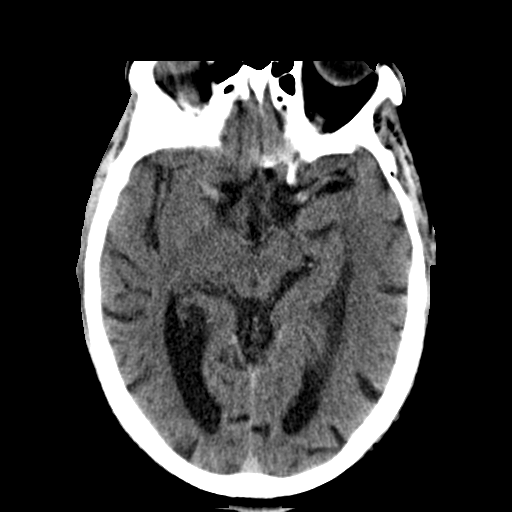
[im 18/35  brain]
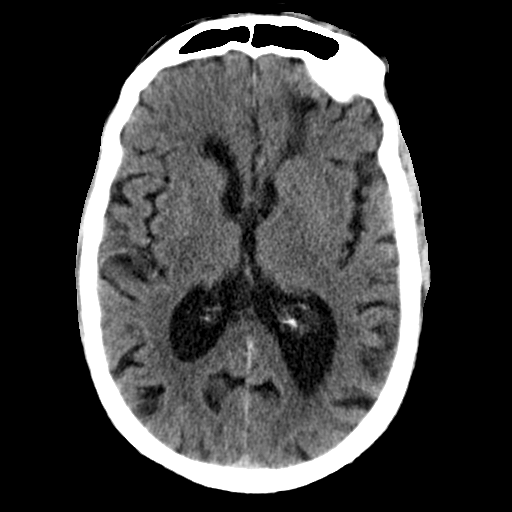
[im 20/35  brain]
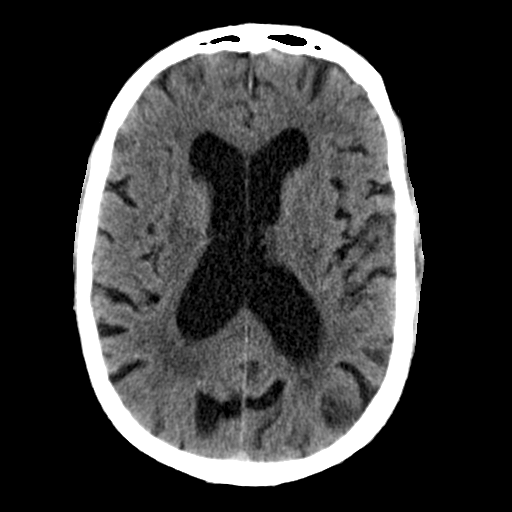
[im 22/35  brain]
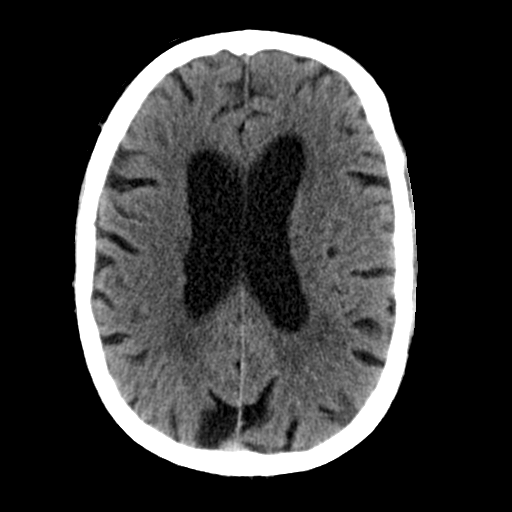
[im 22/35  bone]
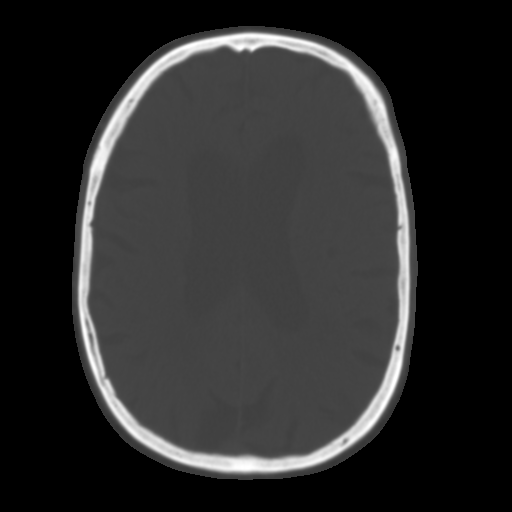
[im 25/35  brain]
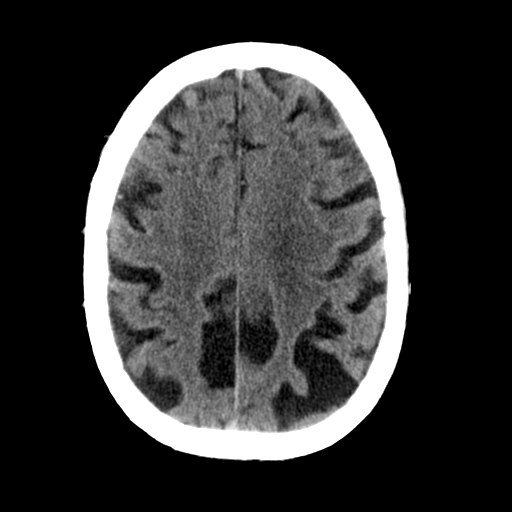
[im 27/35  brain]
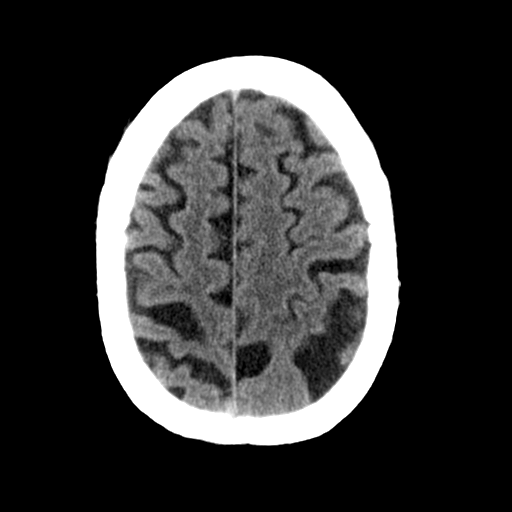
[im 30/35  brain]
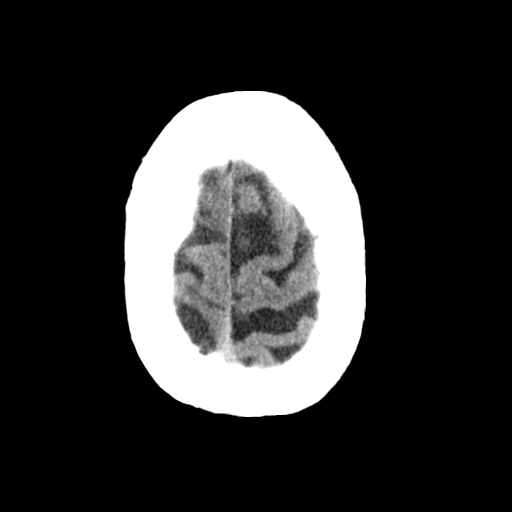
[im 32/35  brain]
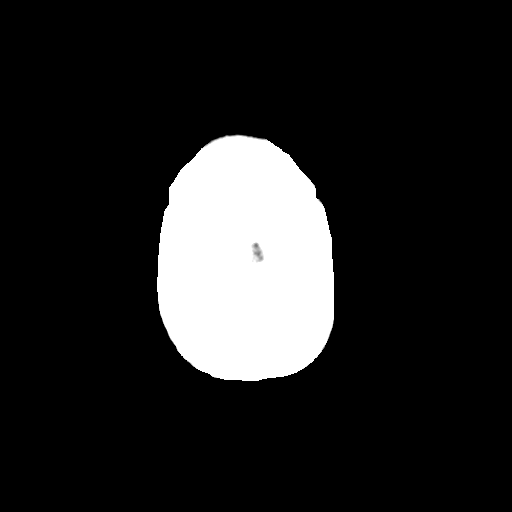
[im 32/35  bone]
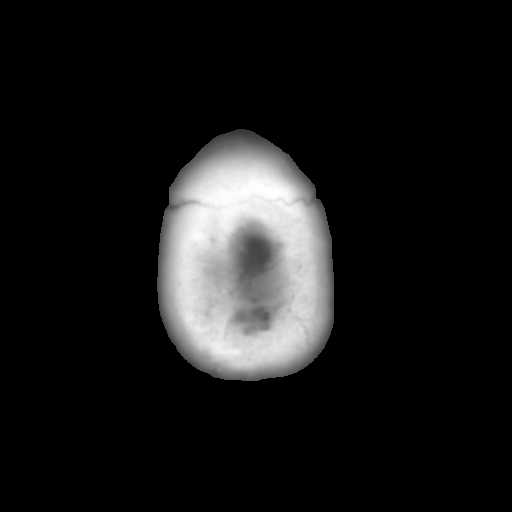

[Series 5: bone · axial · 0.40mm/px · z∈[+262,+288]mm · 2 of 35 slices shown]
[im 3/35  bone]
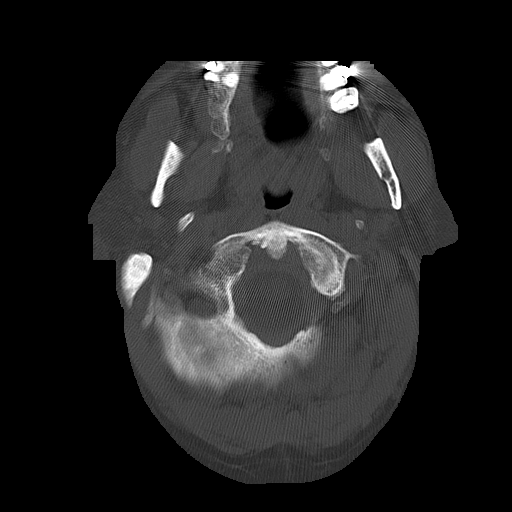
[im 8/35  bone]
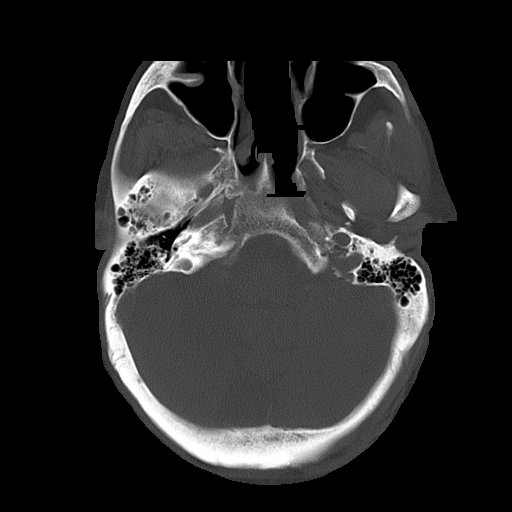

[15 of 30 positions shown; findings below may reference images not displayed]

PROCEDURE:     CT  - CT HEAD WITHOUT CONTRAST  - January 05, 2010  [DATE]

RESULT:     Noncontrast emergent CT of the brain is performed. The patient
has no previous examination for comparison. There is prominence of the
ventricles and sulci consistent with atrophy. There is diffuse
low-attenuation in the periventricular and subcortical white matter region
most consistent with chronic small vessel ischemic disease. There appears to
be a small area of low-attenuation in the floor of the left frontal lobe
suggestive of an old infarct. Fluid attenuation in the inferior paramedian
right cerebellar hemisphere may represent an arachnoid cyst or old infarct.
There is no intracranial hemorrhage, mass effect or midline shift.

The included paranasal sinuses and mastoid show normal appearing aeration.
The calvarium is intact.
IMPRESSION: Chronic small vessel ischemic disease with atrophy and some
old infarcts in the inferior left frontal region. Probable posterior fossa
arachnoid cyst in the right side adjacent to the midline.

## 2011-12-13 ENCOUNTER — Other Ambulatory Visit: Payer: Self-pay | Admitting: Cardiology

## 2011-12-13 NOTE — Telephone Encounter (Signed)
Refilled carvedilol.

## 2012-01-10 ENCOUNTER — Encounter: Payer: Self-pay | Admitting: Internal Medicine

## 2012-01-10 LAB — PROTIME-INR: INR: 2.1 — AB (ref 0.9–1.1)

## 2012-02-11 ENCOUNTER — Telehealth: Payer: Self-pay | Admitting: Cardiology

## 2012-02-11 NOTE — Telephone Encounter (Signed)
New msg Pt called.  He said he has been having sob for about two weeks. No chest pain. Please call back

## 2012-02-11 NOTE — Telephone Encounter (Signed)
Shawn 43 girfriend calling stating Shawn Reyes is experiencing slight SOB and some swelling in lower legs and would like to see dr hochrein--advised dr hochrein can't see him until 03/08/11, did he want to see a PA here in office as we can get him in sooner--pt only wants dr hochrein--advised an appoint with dr hochrein is sced for 03/07/12--in meantime if condition worsens go to nearest ED--he was also told he could see his primary doctor, but he will only go there if things get worse--nt

## 2012-02-15 ENCOUNTER — Encounter: Payer: Self-pay | Admitting: Internal Medicine

## 2012-02-15 LAB — PROTIME-INR: INR: 2.2 — AB (ref 0.9–1.1)

## 2012-02-16 ENCOUNTER — Ambulatory Visit (INDEPENDENT_AMBULATORY_CARE_PROVIDER_SITE_OTHER): Payer: Medicare Other | Admitting: Physician Assistant

## 2012-02-16 ENCOUNTER — Encounter: Payer: Self-pay | Admitting: Physician Assistant

## 2012-02-16 VITALS — BP 150/67 | HR 51 | Ht 70.0 in | Wt 204.0 lb

## 2012-02-16 DIAGNOSIS — I4891 Unspecified atrial fibrillation: Secondary | ICD-10-CM

## 2012-02-16 DIAGNOSIS — I2510 Atherosclerotic heart disease of native coronary artery without angina pectoris: Secondary | ICD-10-CM

## 2012-02-16 DIAGNOSIS — I4892 Unspecified atrial flutter: Secondary | ICD-10-CM | POA: Insufficient documentation

## 2012-02-16 NOTE — Assessment & Plan Note (Signed)
Patient has atrial flutter with a ventricular rate of 50 beats per minute. He's probably had this for several weeks and is feeling poorly with it. Dr. Sharrell Ku came in and discussed in detail with the patient all options including possible ablation, possible need for backup pacemaker, versus treating him medically. He does not think the patient would tolerate any antiarrhythmic because of his bradycardia and is reluctant to cardiovert him because of his bradycardia. He is going to discuss this patient in detail with Dr. [name] who knows the patient well. They will then contact the patient with their final recommendations. The patient is leaning towards proceeding with the ablation which could be scheduled next week.

## 2012-02-16 NOTE — Patient Instructions (Signed)
Dr. Ladona Ridgel or Dr. [name] will contact you with further recommendations.

## 2012-02-16 NOTE — Telephone Encounter (Signed)
Pt seeing Herma Carson, PA today.

## 2012-02-16 NOTE — Assessment & Plan Note (Signed)
Patient had no chest pain and is stable.

## 2012-02-16 NOTE — Progress Notes (Signed)
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 [name] for coronary artery disease status post stent placement in the LAD in 2008. He had residual 30-40% distal LAD, 3040% intermediate, 40% circumflex OM, and the RCA had a 30% followed by 70-80% stenosis and total occlusion. There were left to right collaterals. His ejection fraction was 65%. He also has a history of paroxysmal atrial fibrillation on chronic Coumadin therapy and prior stent to his right carotid.  The patient last saw Dr. [name] in December 2012 at which time he was doing well. No medications were changed.  The patient presents today with several week history of excessive fatigue and dyspnea on exertion. He is found to be in atrial flutter and the office today which is new for him. He denies chest pain, palpitations, dizziness, or presyncope. [place].      No Known Allergies  Current Outpatient Prescriptions on File Prior to Visit  Medication Sig Dispense Refill  . amLODipine (NORVASC) 2.5 MG tablet Take 2.5 mg by mouth daily.        . carvedilol (COREG) 3.125 MG tablet take 1 tablet twice a day  180 tablet  6  . Fish Oil OIL Take 1 tablet by mouth daily.        . hydrochlorothiazide 25 MG tablet Take 25 mg by mouth daily.        . isosorbide mononitrate (IMDUR) 60 MG 24 hr tablet take 1 tablet by mouth once daily  90 tablet  2  . Multiple Vitamin (MULTIVITAMIN) tablet Take 1 tablet by mouth daily.        . nitroGLYCERIN (NITROSTAT) 0.4 MG SL tablet Place 0.4 mg under the tongue every 5 (five) minutes as needed.        . Nutritional Supplements (MELATONIN PO) Take 1.5 mg by mouth daily.        . simvastatin (ZOCOR) 40 MG tablet Take 40 mg by mouth at bedtime.        Marland Kitchen testosterone cypionate (DEPOTESTOTERONE CYPIONATE) 200 MG/ML injection As directed       . WARFARIN SODIUM PO as directed.          Past Medical History    Diagnosis Date  . CAD (coronary artery disease)     Catheterization in November 2008. He had a long restenotic area, 95% stenosis in the previously placed LAD stent. The distal LAD had 30-40% stenosis. Intermediate had 30-40% stenosis. The circumflex had 40% stenosis in  obtuse marginal. The right coronary artery had 30% followed by 70-8-% stenosis followed by total occlusion. There were left to right collaterals. He had stenting with a Taxus   . CAD (coronary artery disease)     stent in the LAD. The Ef was 65%  . Paroxysmal atrial fibrillation     On chronic Coumadin Therapy  . Transient ischemic attack     Questionable  . Sleep apnea   . Prostate cancer     Post radiation  . Diabetes mellitus   . Hypertension   . Dyslipidemia   . Renal insufficiency     chronic  . TIA (transient ischemic attack)     Past Surgical History  Procedure Date  . Esophagogastric fundoplication   . Carotid stent Feb. 2011    Right     No family history on file.  History   Social History  .  Marital Status: Single    Spouse Name: N/A    Number of Children: 2  . Years of Education: N/A   Occupational History  . Not on file.   Social History Main Topics  . Smoking status: Never Smoker   . Smokeless tobacco: Not on file  . Alcohol Use: Yes     1-1/2 Ounces of beer at night  . Drug Use: Not on file  . Sexually Active: Not on file   Other Topics Concern  . Not on file   Social History Narrative  . No narrative on file    ROS: patient recently had a biopsy for skin cancer in his right ear and is scheduled to have it removed on Monday at [facility], he wears bilateral hearing aids. see history of present illness otherwise negative   PHYSICAL EXAM: Well-nournished, in no acute distress, heart of hearing. Neck: right carotid bruit,No JVD, HJR, or thyroid enlargement Lungs: No tachypnea, clear without wheezing, rales, or rhonchi Cardiovascular: irregular at 50 beats per minute with a  2/6 systolic murmur at the left sternal border, no gallops, bruit, thrill, or heave. Abdomen: BS normal. Soft without organomegaly, masses, lesions or tenderness. Extremities: without cyanosis, clubbing or edema. Good distal pulses bilateral SKin: Warm, no lesions or rashes  Musculoskeletal: No deformities Neuro: no focal signs  Ht 5\' 10"  (1.778 m)  Wt 204 lb (92.534 kg)  BMI 29.27 kg/m2   FAO:ZHYQMV flutter at 51 beats per minute poor R-wave progression

## 2012-02-18 ENCOUNTER — Telehealth: Payer: Self-pay | Admitting: Cardiology

## 2012-02-18 DIAGNOSIS — I4892 Unspecified atrial flutter: Secondary | ICD-10-CM

## 2012-02-18 NOTE — Telephone Encounter (Signed)
Looks like Dr Ladona Ridgel is to possibly schedule pt for an ablation next week.  Girlfriend states Dr  Ladona Ridgel is to talk to Dr [name] about the proceude and let them know if it is to be done.  Girlfriend reports pt's HR today has been 40 and 42.  He denies any s/s other than feeling "tired".  Will discuss with Dr [name] and call them back.  She can be reached at the number listed or [phone].

## 2012-02-18 NOTE — Telephone Encounter (Signed)
Please return call to patient and girlfriend Shawn Reyes Dixon-Girlfriend [phone]   Patient heart rate has fluctuated over the last day or two.  Patient has upcoming procedure at [facility] next week.  Patient would like to speak with nurse, please return call at [phone].

## 2012-02-18 NOTE — Telephone Encounter (Signed)
Reviewed with Dr [name] who gave orders for the pt to stop Carvedilol 3.125 mg twice a day.  Pt aware and girlfriend aware.  Will forward to Dr Ladona Ridgel and Tresa Endo to determine if and when procedure is to be scheduled

## 2012-02-22 NOTE — Telephone Encounter (Signed)
F/U - Patient girlfriend calling for status update on procedure scheduling, she can be reached at hm# [phone]. Marland Kitchen

## 2012-02-22 NOTE — Telephone Encounter (Signed)
lmom that Dr Ladona Ridgel spoke with Dr [name] and the recommendation is to proceed with ablation.  We just need to get the last 3 INR results and then we can set up

## 2012-02-22 NOTE — Telephone Encounter (Signed)
Dr Dan Humphreys at [facility] follows his  INR.  They are going to have them sent to [place] tomorrow

## 2012-02-23 NOTE — Telephone Encounter (Signed)
Follow-up:     Patient called in wanting to know if there was anything else he needed to do to prepare for his procedure. Please call back.

## 2012-02-23 NOTE — Telephone Encounter (Signed)
Spoke with girlfriend have not gotten the labs yet.

## 2012-02-24 ENCOUNTER — Other Ambulatory Visit: Payer: Self-pay | Admitting: *Deleted

## 2012-02-24 ENCOUNTER — Encounter: Payer: Self-pay | Admitting: *Deleted

## 2012-02-24 ENCOUNTER — Other Ambulatory Visit (INDEPENDENT_AMBULATORY_CARE_PROVIDER_SITE_OTHER): Payer: Medicare Other

## 2012-02-24 DIAGNOSIS — I4892 Unspecified atrial flutter: Secondary | ICD-10-CM

## 2012-02-24 LAB — CBC WITH DIFFERENTIAL/PLATELET
Basophils Absolute: 0 10*3/uL (ref 0.0–0.1)
Basophils Relative: 0.4 % (ref 0.0–3.0)
Eosinophils Absolute: 0.3 10*3/uL (ref 0.0–0.7)
Hemoglobin: 17.4 g/dL — ABNORMAL HIGH (ref 13.0–17.0)
Lymphocytes Relative: 13.3 % (ref 12.0–46.0)
MCHC: 34.3 g/dL (ref 30.0–36.0)
Monocytes Relative: 11 % (ref 3.0–12.0)
Neutrophils Relative %: 72.1 % (ref 43.0–77.0)
RBC: 5.16 Mil/uL (ref 4.22–5.81)

## 2012-02-24 LAB — BASIC METABOLIC PANEL
CO2: 24 mEq/L (ref 19–32)
Chloride: 100 mEq/L (ref 96–112)
Potassium: 3.7 mEq/L (ref 3.5–5.1)

## 2012-02-24 LAB — PROTIME-INR
INR: 2.2 ratio — ABNORMAL HIGH (ref 0.8–1.0)
Prothrombin Time: 23.8 s — ABNORMAL HIGH (ref 10.2–12.4)

## 2012-02-24 NOTE — Telephone Encounter (Signed)
He is scheduled for 02/28/12 at 12  Labs today

## 2012-02-25 ENCOUNTER — Telehealth: Payer: Self-pay | Admitting: Internal Medicine

## 2012-02-25 NOTE — Telephone Encounter (Signed)
Pt calling re surgery Monday and had question re meds

## 2012-02-25 NOTE — Telephone Encounter (Signed)
Labs look good for Monday for ablation

## 2012-02-28 ENCOUNTER — Encounter (HOSPITAL_COMMUNITY)
Admission: RE | Disposition: A | Discharge status: Home / Self Care | Payer: Self-pay | Source: Ambulatory Visit | Attending: Internal Medicine

## 2012-02-28 ENCOUNTER — Ambulatory Visit (HOSPITAL_COMMUNITY)
Admission: RE | Admit: 2012-02-28 | Discharge: 2012-02-29 | Disposition: A | Discharge status: Home / Self Care | Payer: Medicare Other | Source: Ambulatory Visit | Attending: Internal Medicine | Admitting: Internal Medicine

## 2012-02-28 DIAGNOSIS — I2510 Atherosclerotic heart disease of native coronary artery without angina pectoris: Secondary | ICD-10-CM | POA: Insufficient documentation

## 2012-02-28 DIAGNOSIS — E119 Type 2 diabetes mellitus without complications: Secondary | ICD-10-CM | POA: Insufficient documentation

## 2012-02-28 DIAGNOSIS — I129 Hypertensive chronic kidney disease with stage 1 through stage 4 chronic kidney disease, or unspecified chronic kidney disease: Secondary | ICD-10-CM | POA: Insufficient documentation

## 2012-02-28 DIAGNOSIS — I4891 Unspecified atrial fibrillation: Secondary | ICD-10-CM | POA: Insufficient documentation

## 2012-02-28 DIAGNOSIS — I4892 Unspecified atrial flutter: Secondary | ICD-10-CM | POA: Insufficient documentation

## 2012-02-28 DIAGNOSIS — E785 Hyperlipidemia, unspecified: Secondary | ICD-10-CM | POA: Insufficient documentation

## 2012-02-28 DIAGNOSIS — Z7901 Long term (current) use of anticoagulants: Secondary | ICD-10-CM | POA: Insufficient documentation

## 2012-02-28 DIAGNOSIS — N189 Chronic kidney disease, unspecified: Secondary | ICD-10-CM | POA: Insufficient documentation

## 2012-02-28 DIAGNOSIS — Z9861 Coronary angioplasty status: Secondary | ICD-10-CM | POA: Insufficient documentation

## 2012-02-28 HISTORY — DX: Gastro-esophageal reflux disease without esophagitis: K21.9

## 2012-02-28 HISTORY — DX: Unspecified osteoarthritis, unspecified site: M19.90

## 2012-02-28 HISTORY — PX: ATRIAL FLUTTER ABLATION: SHX5733

## 2012-02-28 LAB — GLUCOSE, CAPILLARY
Glucose-Capillary: 138 mg/dL — ABNORMAL HIGH (ref 70–99)
Glucose-Capillary: 116 mg/dL — ABNORMAL HIGH (ref 70–99)
Glucose-Capillary: 144 mg/dL — ABNORMAL HIGH (ref 70–99)

## 2012-02-28 LAB — PROTIME-INR
INR: 2.15 — ABNORMAL HIGH (ref 0.00–1.49)
Prothrombin Time: 24.4 seconds — ABNORMAL HIGH (ref 11.6–15.2)

## 2012-02-28 SURGERY — ATRIAL FLUTTER ABLATION
Anesthesia: LOCAL

## 2012-02-28 MED ORDER — FENTANYL CITRATE 0.05 MG/ML IJ SOLN
INTRAMUSCULAR | Status: AC
  Filled 2012-02-28: qty 2

## 2012-02-28 MED ORDER — SIMVASTATIN 40 MG PO TABS
40.0000 mg | ORAL_TABLET | Freq: Every day | ORAL | Status: DC
  Administered 2012-02-28: 40 mg via ORAL
  Filled 2012-02-28 (×2): qty 1

## 2012-02-28 MED ORDER — HYDROCHLOROTHIAZIDE 25 MG PO TABS
25.0000 mg | ORAL_TABLET | Freq: Every day | ORAL | Status: DC
  Administered 2012-02-29: 25 mg via ORAL
  Filled 2012-02-28 (×2): qty 1

## 2012-02-28 MED ORDER — MIDAZOLAM HCL 5 MG/5ML IJ SOLN
INTRAMUSCULAR | Status: AC
  Filled 2012-02-28: qty 5

## 2012-02-28 MED ORDER — ISOSORBIDE MONONITRATE 15 MG HALF TABLET
15.0000 mg | ORAL_TABLET | Freq: Every day | ORAL | Status: DC
  Administered 2012-02-29: 15 mg via ORAL
  Filled 2012-02-28 (×2): qty 1

## 2012-02-28 MED ORDER — SODIUM CHLORIDE 0.9 % IJ SOLN: 3.0000 mL | INTRAMUSCULAR | Status: DC | PRN

## 2012-02-28 MED ORDER — OMEGA-3-ACID ETHYL ESTERS 1 G PO CAPS
1.0000 g | ORAL_CAPSULE | Freq: Every day | ORAL | Status: DC
  Administered 2012-02-28 – 2012-02-29 (×2): 1 g via ORAL
  Filled 2012-02-28 (×2): qty 1

## 2012-02-28 MED ORDER — MAGNESIUM SULFATE 50 % IJ SOLN
INTRAMUSCULAR | Status: AC
  Filled 2012-02-28: qty 2

## 2012-02-28 MED ORDER — SODIUM CHLORIDE 0.9 % IV SOLN: 250.0000 mL | INTRAVENOUS | Status: DC | PRN

## 2012-02-28 MED ORDER — BIOTENE DRY MOUTH MT LIQD
15.0000 mL | Freq: Two times a day (BID) | OROMUCOSAL | Status: DC
  Administered 2012-02-28 – 2012-02-29 (×2): 15 mL via OROMUCOSAL

## 2012-02-28 MED ORDER — ACETAMINOPHEN 325 MG PO TABS: 650.0000 mg | ORAL_TABLET | ORAL | Status: DC | PRN

## 2012-02-28 MED ORDER — WARFARIN SODIUM 2.5 MG PO TABS
2.5000 mg | ORAL_TABLET | Freq: Once | ORAL | Status: AC
  Administered 2012-02-28: 2.5 mg via ORAL
  Filled 2012-02-28: qty 1

## 2012-02-28 MED ORDER — CARVEDILOL 3.125 MG PO TABS
3.1250 mg | ORAL_TABLET | Freq: Two times a day (BID) | ORAL | Status: DC
  Administered 2012-02-28 – 2012-02-29 (×2): 3.125 mg via ORAL
  Filled 2012-02-28 (×4): qty 1

## 2012-02-28 MED ORDER — BUPIVACAINE HCL (PF) 0.25 % IJ SOLN
INTRAMUSCULAR | Status: AC
  Filled 2012-02-28: qty 30

## 2012-02-28 MED ORDER — AMLODIPINE BESYLATE 2.5 MG PO TABS
2.5000 mg | ORAL_TABLET | Freq: Every day | ORAL | Status: DC
  Administered 2012-02-28 – 2012-02-29 (×2): 2.5 mg via ORAL
  Filled 2012-02-28 (×2): qty 1

## 2012-02-28 MED ORDER — FISH OIL OIL: 1.0000 | TOPICAL_OIL | Freq: Every day | Status: DC

## 2012-02-28 MED ORDER — WARFARIN - PHYSICIAN DOSING INPATIENT: Freq: Every day | Status: DC

## 2012-02-28 MED ORDER — ONDANSETRON HCL 4 MG/2ML IJ SOLN: 4.0000 mg | Freq: Four times a day (QID) | INTRAMUSCULAR | Status: DC | PRN

## 2012-02-28 MED ORDER — ONE-DAILY MULTI VITAMINS PO TABS
1.0000 | ORAL_TABLET | Freq: Every day | ORAL | Status: DC
  Administered 2012-02-29: 1 via ORAL
  Filled 2012-02-28: qty 1

## 2012-02-28 MED ORDER — AMIODARONE HCL 200 MG PO TABS
200.0000 mg | ORAL_TABLET | Freq: Two times a day (BID) | ORAL | Status: DC
  Administered 2012-02-28 – 2012-02-29 (×2): 200 mg via ORAL
  Filled 2012-02-28 (×3): qty 1

## 2012-02-28 MED ORDER — SODIUM CHLORIDE 0.9 % IJ SOLN
3.0000 mL | Freq: Two times a day (BID) | INTRAMUSCULAR | Status: DC
  Administered 2012-02-28: 3 mL via INTRAVENOUS

## 2012-02-28 NOTE — Op Note (Signed)
Shawn Reyes, DOLBERRY NO.:  [id number]  MEDICAL RECORD NO.:  [id number]  LOCATION:  3714                         FACILITY:  [facility]  PHYSICIAN:  Doylene Canning. Ladona Ridgel, MD    DATE OF BIRTH:  December 26, 1920  DATE OF PROCEDURE:  02/28/2012 DATE OF DISCHARGE:                              OPERATIVE REPORT   PROCEDURE PERFORMED:  Electrophysiologic study and RF catheter ablation of the atrial flutter isthmus.  INTRODUCTION:  The patient is an 78 year old male with a history of paroxysmal AFib.  He has had recurrent atrial flutter and was symptomatic.  He was scheduled for a flutter ablation.  The patient has had bradycardia with his atrial flutter and a very slow ventricular response.  Because of his atrial flutter, he is referred for catheter ablation.  He has been therapeutically anticoagulated.  It has been noted at the time of the procedure, the patient was in atrial fibrillation.  PROCEDURE:  After informed consent was obtained, the patient was taken to the diagnostic EP lab in a fasting state.  After usual preparation and draping, intravenous fentanyl and midazolam were given for sedation. A 7-French 20-pole Halo catheter was inserted percutaneously into the right femoral vein and advanced to right atrium.  A 6-French quadripolar catheter was inserted percutaneously in the right femoral vein and advanced to the His bundle region.  A 7-French quadripolar catheter was inserted percutaneously in the right femoral vein and advanced to the right atrium.  Mapping demonstrated atrial fibrillation.  The patient was sedated and was cardioverted with 200 joules of synchronized biphasic energy, restoring sinus rhythm.  Isthmus conduction was found to be present.  At a pacing cycle length of 600 msec, RF energy was subsequently applied to the usual atrial flutter isthmus as the patient had previously had typical atrial flutter.  A total of 5 RF energy applications were delivered  resulting in the creation of atrial flutter isthmus block after the 1st RF energy application and including 4 bonus RF energy applications.  The patient was observed for approximately 20 minutes.  During this time, rapid ventricular pacing was carried out from the right ventricle demonstrating VA dissociation at 600 msec.  Programmed ventricular stimulation demonstrated VA dissociation at 600 msec.  Programmed atrial stimulation and rapid atrial pacing demonstrated an AV Wenckebach cycle length of 580 msec. At this point, the catheters were removed.  Hemostasis was assured, and the patient was returned to his room in satisfactory condition.  COMPLICATIONS:  There were no immediate procedure complications.  RESULTS:  A.  Baseline ECG.  Baseline ECG demonstrates atrial fibrillation with a very slow ventricular response at 50 beats per minute. B.  Baseline intervals.  The HV interval was 45 msec.  The QRS duration was 80 msec. C.  Rapid ventricular pacing.  Following ablation, rapid ventricular pacing demonstrated VA dissociation at 600 msec. D.  Programmed ventricular stimulation.  Programmed ventricular stimulation was carried out following ablation demonstrating VA dissociation at 600 msec. E.  Rapid atrial pacing.  Rapid atrial pacing was carried out from the coronary sinus and the right atrium demonstrating AV Wenckebach at 580 msec. F.  Programmed atrial stimulation.  Programmed atrial stimulation was carried out at a base drive cycle length of 409 msec and AV node ERP was demonstrated greater than 540 msec. G.  Arrhythmias observed. 1. Atrial fibrillation, initiation present at the time of EP study,     duration was sustained.  Termination was with DC cardioversion.     a.     Mapping.  Mapping of the atrial flutter isthmus demonstrated      usual size and orientation.     b.     RF energy application.  Total of 5 RF energy applications      were delivered to the atrial flutter  isthmus resulting in creation      of bidirectional atrial flutter isthmus block.  CONCLUSION:  This study demonstrates successful catheter ablation of the atrial flutter isthmus in a patient with recurrent documented atrial flutter.  The patient was in atrial fibrillation at the time of the procedure.  It will be anticipated that he will be discharged home on low-dose amiodarone.     Doylene Canning. Ladona Ridgel, MD     GWT/MEDQ  D:  02/28/2012  T:  02/28/2012  Job:  [id number]  cc:   [name], MD, [facility]

## 2012-02-28 NOTE — H&P (Signed)
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 [name] for coronary artery disease status post stent placement in the LAD in 2008. He had residual 30-40% distal LAD, 3040% intermediate, 40% circumflex OM, and the RCA had a 30% followed by 70-80% stenosis and total occlusion. There were left to right collaterals. His ejection fraction was 65%. He also has a history of paroxysmal atrial fibrillation on chronic Coumadin therapy and prior stent to his right carotid.  The patient last saw Dr. [name] in December 2012 at which time he was doing well. No medications were changed.  The patient presents today with several week history of excessive fatigue and dyspnea on exertion. He is found to be in atrial flutter and the office today which is new for him. He denies chest pain, palpitations, dizziness, or presyncope. [place].  No Known Allergies  Current Outpatient Prescriptions on File Prior to Visit   Medication  Sig  Dispense  Refill   .  amLODipine (NORVASC) 2.5 MG tablet  Take 2.5 mg by mouth daily.     .  carvedilol (COREG) 3.125 MG tablet  take 1 tablet twice a day  180 tablet  6   .  Fish Oil OIL  Take 1 tablet by mouth daily.     .  hydrochlorothiazide 25 MG tablet  Take 25 mg by mouth daily.     .  isosorbide mononitrate (IMDUR) 60 MG 24 hr tablet  take 1 tablet by mouth once daily  90 tablet  2   .  Multiple Vitamin (MULTIVITAMIN) tablet  Take 1 tablet by mouth daily.     .  nitroGLYCERIN (NITROSTAT) 0.4 MG SL tablet  Place 0.4 mg under the tongue every 5 (five) minutes as needed.     .  Nutritional Supplements (MELATONIN PO)  Take 1.5 mg by mouth daily.     .  simvastatin (ZOCOR) 40 MG tablet  Take 40 mg by mouth at bedtime.     Marland Kitchen  testosterone cypionate (DEPOTESTOTERONE CYPIONATE) 200 MG/ML injection  As directed     .  WARFARIN SODIUM PO  as directed.      Past Medical History   Diagnosis   Date   .  CAD (coronary artery disease)      Catheterization in November 2008. He had a long restenotic area, 95% stenosis in the previously placed LAD stent. The distal LAD had 30-40% stenosis. Intermediate had 30-40% stenosis. The circumflex had 40% stenosis in obtuse marginal. The right coronary artery had 30% followed by 70-8-% stenosis followed by total occlusion. There were left to right collaterals. He had stenting with a Taxus   .  CAD (coronary artery disease)      stent in the LAD. The Ef was 65%   .  Paroxysmal atrial fibrillation      On chronic Coumadin Therapy   .  Transient ischemic attack      Questionable   .  Sleep apnea    .  Prostate cancer      Post radiation   .  Diabetes mellitus    .  Hypertension    .  Dyslipidemia    .  Renal insufficiency      chronic   .  TIA (transient ischemic attack)     Past Surgical History   Procedure  Date   .  Esophagogastric fundoplication    .  Carotid stent  Feb. 2011     Right    No family history on file.  History    Social History   .  Marital Status:  Single     Spouse Name:  N/A     Number of Children:  2   .  Years of Education:  N/A    Occupational History   .  Not on file.    Social History Main Topics   .  Smoking status:  Never Smoker   .  Smokeless tobacco:  Not on file   .  Alcohol Use:  Yes      1-1/2 Ounces of beer at night   .  Drug Use:  Not on file   .  Sexually Active:  Not on file    Other Topics  Concern   .  Not on file    Social History Narrative   .  No narrative on file    ROS: patient recently had a biopsy for skin cancer in his right ear and is scheduled to have it removed on Monday at [facility], he wears bilateral hearing aids. see history of present illness otherwise negative  PHYSICAL EXAM: Well-nournished, in no acute distress, heart of hearing.  Neck: right carotid bruit,No JVD, HJR, or thyroid enlargement  Lungs: No tachypnea, clear without wheezing, rales, or rhonchi    Cardiovascular: irregular at 50 beats per minute with a 2/6 systolic murmur at the left sternal border, no gallops, bruit, thrill, or heave.  Abdomen: BS normal. Soft without organomegaly, masses, lesions or tenderness.  Extremities: without cyanosis, clubbing or edema. Good distal pulses bilateral  SKin: Warm, no lesions or rashes  Musculoskeletal: No deformities  Neuro: no focal signs  Ht 5\' 10"  (1.778 m)  Wt 204 lb (92.534 kg)  BMI 29.27 kg/m2  HQI:ONGEXB flutter at 51 beats per minute poor R-wave progression   Atrial flutter - Patient has atrial flutter with a ventricular rate of 50 beats per minute. He's probably had this for several weeks and is feeling poorly with it. I dicussed in detail with the patient all options including possible ablation, possible need for backup pacemaker, versus treating him medically. He does not think the patient would tolerate any antiarrhythmic because of his bradycardia and is reluctant to cardiovert him because of his bradycardia. I discussed the treatment options with the patient and Dr. [name] in detail. The risks/benefits/goals/expectations of catheter ablation of atrial flutter have been discussed and he wishes to proceed.  Lewayne Bunting, M.D.

## 2012-02-28 NOTE — Op Note (Signed)
EPS/RFA of atrial flutter isthmus without immediate complication. [id number].

## 2012-02-29 ENCOUNTER — Encounter (HOSPITAL_COMMUNITY): Payer: Self-pay | Admitting: General Practice

## 2012-02-29 DIAGNOSIS — I4892 Unspecified atrial flutter: Secondary | ICD-10-CM

## 2012-02-29 LAB — PROTIME-INR
INR: 2.04 — ABNORMAL HIGH (ref 0.00–1.49)
Prothrombin Time: 23.4 seconds — ABNORMAL HIGH (ref 11.6–15.2)

## 2012-02-29 MED ORDER — ISOSORBIDE MONONITRATE ER 30 MG PO TB24: 30.0000 mg | ORAL_TABLET | Freq: Every day | ORAL | Status: DC

## 2012-02-29 MED ORDER — ATORVASTATIN CALCIUM 20 MG PO TABS
20.0000 mg | ORAL_TABLET | Freq: Every day | ORAL | Status: DC
  Filled 2012-02-29: qty 1

## 2012-02-29 MED ORDER — AMIODARONE HCL 200 MG PO TABS: 200.0000 mg | ORAL_TABLET | Freq: Every day | ORAL | Status: DC

## 2012-02-29 MED ORDER — CARVEDILOL 3.125 MG PO TABS: 3.1250 mg | ORAL_TABLET | Freq: Two times a day (BID) | ORAL | Status: DC

## 2012-02-29 NOTE — Progress Notes (Addendum)
   ELECTROPHYSIOLOGY ROUNDING NOTE    Patient Name: Shawn Reyes Date of Encounter: 02-29-2012    SUBJECTIVE:Patient feels well.  No chest pain or shortness of breath.  S/p RFCA of atrial flutter 02-28-2012.   TELEMETRY: Reviewed telemetry pt in mobitz II with 2:1 AV block, asymptomatic Filed Vitals:   02/28/12 1758 02/28/12 1849 02/28/12 2128 02/29/12 0634  BP: 113/69  154/85 165/71  Pulse: 65 72 75 66  Temp: 97.8 F (36.6 C)  98.4 F (36.9 C) 97.5 F (36.4 C)  TempSrc: Oral  Oral Oral  Resp: 18  18 20   Height:      Weight:      SpO2: 96%  95% 96%    Intake/Output Summary (Last 24 hours) at 02/29/12 0732 Last data filed at 02/28/12 1900  Gross per 24 hour  Intake    240 ml  Output      0 ml  Net    240 ml     Current Medications: . amiodarone  200 mg Oral BID  . amLODipine  2.5 mg Oral Daily  . antiseptic oral rinse  15 mL Mouth Rinse BID  . carvedilol  3.125 mg Oral BID WC  . hydrochlorothiazide  25 mg Oral Daily  . isosorbide mononitrate  15 mg Oral Daily  . multivitamin  1 tablet Oral Daily  . omega-3 acid ethyl esters  1 g Oral Daily  . simvastatin  40 mg Oral QHS  . sodium chloride  3 mL Intravenous Q12H  . warfarin  2.5 mg Oral ONCE-1800    LABS: INR- 2.04  PHYSICAL EXAM Groin without hematoma or ecchymosis CV - RRR without murmur. Lungs - clear bilaterally Groin - No hematoma Neuro - difficult hearing.  A/P 1. Atrial flutter 2. Atrial fib 3. CAD REC:  Pt has not yet ambulated this morning.  Plan post ablation was for low dose Amiodarone since atrial fibrillation was identified during procedure.  Pt also on Coreg 3.125mg  twice daily. Will need to watch for symptomatic bradycardia as he may ultimately require PPM. Will try to maintain NSR as he felt poorly in atrial flutter. Usual followup.  Lewayne Bunting, M.D.

## 2012-02-29 NOTE — Discharge Instructions (Signed)
Electrical Cardioversion Cardioversion is the delivery of a jolt of electricity to change the rhythm of the heart. Sticky patches or metal paddles are placed on the chest to deliver the electricity from a special device. This is done to restore a normal rhythm. A rhythm that is too fast or not regular keeps the heart from pumping well. Compared to medicines used to change an abnormal rhythm, cardioversion is faster and works better. It is also unpleasant and may dislodge blood clots from the heart. WHEN WOULD THIS BE DONE?  In an emergency:   There is low or no blood pressure as a result of the heart rhythm.   Normal rhythm must be restored as fast as possible to protect the brain and heart from further damage.   It may save a life.   For less serious heart rhythms, such as atrial fibrillation or flutter, in which:   The heart is beating too fast or is not regular.   The heart is still able to pump enough blood, but not as well as it should.   Medicine to change the rhythm has not worked.   It is safe to wait in order to allow time for preparation.  LET YOUR CAREGIVER KNOW ABOUT:   Every medicine you are taking. It is very important to do this! Know when to take or stop taking any of them.   Any time in the past that you have felt your heart was not beating normally.  RISKS AND COMPLICATIONS   Clots may form in the chambers of the heart if it is beating too fast. These clots may be dislodged during the procedure and travel to other parts of the body.   There is risk of a stroke during and after the procedure if a clot moves. Blood thinners lower this risk.   You may have a special test of your heart (TEE) to make sure there are no clots in your heart.  BEFORE THE PROCEDURE   You may have some tests to see how well your heart is working.   You may start taking blood thinners so your blood does not clot as easily.   Other drugs may be given to help your heart work better.   PROCEDURE (SCHEDULED)  The procedure is typically done in a hospital by a heart doctor (cardiologist).   You will be told when and where to go.   You may be given some medicine through an intravenous (IV) access to reduce discomfort and make you sleepy before the procedure.   Your whole body may move when the shock is delivered. Your chest may feel sore.   You may be able to go home after a few hours. Your heart rhythm will be watched to make sure it does not change.  HOME CARE INSTRUCTIONS   Only take medicine as directed by your caregiver. Be sure you understand how and when to take your medicine.   Learn how to feel your pulse and check it often.   Limit your activity for 48 hours.   Avoid caffeine and other stimulants as directed.  SEEK MEDICAL CARE IF:   You feel like your heart is beating too fast or your pulse is not regular.   You have any questions about your medicines.   You have bleeding that will not stop.  SEEK IMMEDIATE MEDICAL CARE IF:   You are dizzy or feel faint.   It is hard to breathe or you feel short of breath.     There is a change in discomfort in your chest.   Your speech is slurred or you have trouble moving your arm or leg on one side.   You get a muscle cramp.   Your fingers or toes turn cold or blue.  MAKE SURE YOU:   Understand these instructions.   Will watch your condition.   Will get help right away if you are not doing well or get worse.  Document Released: 10/08/2002 Document Revised: 10/07/2011 Document Reviewed: 02/07/2008 [employer] Patient Information 2012 [employer]. 

## 2012-02-29 NOTE — Discharge Summary (Signed)
ELECTROPHYSIOLOGY PROCEDURE DISCHARGE SUMMARY    Patient ID: Shawn Reyes,  MRN: [id number], DOB/AGE: 76-Mar-1922 76 y.o.  Admit date: 02/28/2012 Discharge date: 02/29/2012  Primary Cardiologist: [name], MD Electrophysiologist: Lewayne Bunting, MD  Primary Discharge Diagnosis:  Atrial flutter status post ablation this admission  Secondary Discharge Diagnosis:  1.  Coronary artery disease- Catheterization in November 2008. He had a long restenotic area, 95% stenosis in the previously placed LAD stent. The distal LAD had 30-40% stenosis. Intermediate had 30-40% stenosis. The circumflex had 40% stenosis in obtuse marginal. The right coronary artery had 30% followed by 70-8-% stenosis followed by total occlusion. There were left to right collaterals. He had stenting with a Taxus stent in the LAD, EF was 65%. 2.  Atrial fibrillation 3.  TIA 4.  Sleep apnea 5.  Prostate cancer post radiation 6.  Diabetes 7.  Hypertension 8.  Dyslipidemia 9.  Renal insufficiency  Procedures This Admission:  1.  Electrophysiology study and radiofrequency catheter ablation of atrial flutter on 02-28-2012 by Dr Ladona Ridgel.  This demonstrated successful catheter ablation of the atrial flutter isthmus.  The patient was in atrial fibrillation at the time of the procedure.  The patient had no early apparent complications.   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 He had residual 30-40% distal LAD, 3040% intermediate, 40% circumflex OM, and the RCA had a 30% followed by 70-80% stenosis and total occlusion. There were left to right collaterals. His ejection fraction was 65%. He also has a history of paroxysmal atrial fibrillation on chronic Coumadin therapy and prior stent to his right carotid.  The patient last saw Dr. [name] in December 2012 at which time he was doing  well. No medications were changed.  The patient presents today with several week history of excessive fatigue and dyspnea on exertion. He is found to be in atrial flutter and the office today which is new for him. He denies chest pain, palpitations, dizziness, or presyncope.  He was evaluated by Dr Ladona Ridgel in the outpatient setting for treatment options.  Risks, benefits, and alternatives of atrial flutter ablation were reviewed with the patient who wished to proceed.   Hospital Course:  The patient was admitted on 02-28-2012 for planned ablation of atrial flutter.  This was carried out by Dr Ladona Ridgel with details as outlined above.  He was monitored on telemetry overnight which demonstrated Mobitz II heart block with a ventricular rate in the 60's.  His groin incision was without hematoma or ecchymosis.  Dr Ladona Ridgel examined the patient and considered him stable for discharge to home on low dose Amiodarone.   Discharge Vitals: Blood pressure 165/71, pulse 66, temperature 97.5 F (36.4 C), temperature source Oral, resp. rate 20, height 5\' 10"  (1.778 m), weight 198 lb (89.812 kg), SpO2 96.00%.    Labs:   Lab Results  Component Value Date   WBC 8.4 02/24/2012   HGB 17.4* 02/24/2012   HCT 50.7 02/24/2012   MCV 98.3 02/24/2012   PLT 107.0* 02/24/2012    Lab 02/24/12 1447  NA 136  K 3.7  CL 100  CO2 24  BUN 29*  CREATININE 1.4  CALCIUM 8.8  PROT --  BILITOT --  ALKPHOS --  ALT --  AST --  GLUCOSE 114*    Discharge Medications:  Medication List  As of 02/29/2012  9:52 AM  ASK your doctor about these medications         amLODipine 2.5 MG tablet   Commonly known as: NORVASC   Take 2.5 mg by mouth daily.      Fish Oil Oil   Take 1 tablet by mouth daily.      hydrochlorothiazide 25 MG tablet   Commonly known as: HYDRODIURIL   Take 25 mg by mouth daily.      isosorbide mononitrate 60 MG 24 hr tablet   Commonly known as: IMDUR   take 1 tablet by mouth once daily      MELATONIN PO    Take 1.5 mg by mouth daily.      multivitamin tablet   Take 1 tablet by mouth daily.      nitroGLYCERIN 0.4 MG SL tablet   Commonly known as: NITROSTAT   Place 0.4 mg under the tongue every 5 (five) minutes as needed.      simvastatin 40 MG tablet   Commonly known as: ZOCOR   Take 40 mg by mouth at bedtime.      testosterone cypionate 200 MG/ML injection   Commonly known as: DEPOTESTOTERONE CYPIONATE   As directed      warfarin 4 MG tablet   Commonly known as: COUMADIN   Take 4 mg by mouth daily.            Disposition:  Discharge Orders    Future Appointments: Provider: Department: Dept Phone: Center:   03/30/2012 11:45 AM Marinus Maw, MD [facility] [phone] [id number]       Duration of Discharge Encounter: Greater than 30 minutes including physician time.  Signed, Gypsy Balsam, RN, BSN 02/29/2012, 9:52 AM

## 2012-03-01 ENCOUNTER — Telehealth: Payer: Self-pay | Admitting: Internal Medicine

## 2012-03-01 NOTE — Telephone Encounter (Signed)
Please have your INR checked at Dr Sonny Dandy office this week.  Patient and girlfriend aware

## 2012-03-01 NOTE — Telephone Encounter (Signed)
Pt was to call dr walker to make appt per dr Ladona Ridgel, dr walker wants to know if appt needs to be before or after appt with taylor on 5-30?

## 2012-03-07 ENCOUNTER — Ambulatory Visit: Payer: PRIVATE HEALTH INSURANCE | Admitting: Cardiology

## 2012-03-13 ENCOUNTER — Other Ambulatory Visit: Payer: Self-pay | Admitting: Cardiology

## 2012-03-30 ENCOUNTER — Encounter: Payer: Medicare Other | Admitting: Internal Medicine

## 2012-04-07 ENCOUNTER — Ambulatory Visit (INDEPENDENT_AMBULATORY_CARE_PROVIDER_SITE_OTHER): Payer: Medicare Other | Admitting: Internal Medicine

## 2012-04-07 ENCOUNTER — Encounter: Payer: Self-pay | Admitting: Internal Medicine

## 2012-04-07 VITALS — BP 130/68 | HR 65 | Ht 70.0 in | Wt 206.8 lb

## 2012-04-07 DIAGNOSIS — I4891 Unspecified atrial fibrillation: Secondary | ICD-10-CM

## 2012-04-07 DIAGNOSIS — I4892 Unspecified atrial flutter: Secondary | ICD-10-CM

## 2012-04-07 DIAGNOSIS — I10 Essential (primary) hypertension: Secondary | ICD-10-CM

## 2012-04-07 NOTE — Assessment & Plan Note (Signed)
His blood pressure today is well controlled. He will continue his current medical therapy. He'll maintain a low-sodium diet.

## 2012-04-07 NOTE — Progress Notes (Signed)
HPI Shawn Reyes returns today for followup. 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 There is a remote history of atrial fibrillation. He denies chest pain or shortness of breath. With exertion, he notes some mild fatigue. No peripheral edema. No syncope.  No Known Allergies   Current Outpatient Prescriptions  Medication Sig Dispense Refill  . amiodarone (PACERONE) 200 MG tablet Take 1 tablet (200 mg total) by mouth daily.  30 tablet  6  . amLODipine (NORVASC) 2.5 MG tablet Take 2.5 mg by mouth daily.        . carvedilol (COREG) 3.125 MG tablet Take 1 tablet (3.125 mg total) by mouth 2 (two) times daily with a meal.  60 tablet  6  . Fish Oil OIL Take 1 tablet by mouth daily.        . hydrochlorothiazide 25 MG tablet Take 25 mg by mouth daily.        . isosorbide mononitrate (IMDUR) 30 MG 24 hr tablet Take 1 tablet (30 mg total) by mouth daily.  30 tablet  6  . Multiple Vitamin (MULTIVITAMIN) tablet Take 1 tablet by mouth daily.        . nitroGLYCERIN (NITROSTAT) 0.4 MG SL tablet Place 0.4 mg under the tongue every 5 (five) minutes as needed.        . Nutritional Supplements (MELATONIN PO) Take 1.5 mg by mouth daily.        . simvastatin (ZOCOR) 40 MG tablet Take 40 mg by mouth at bedtime.        Marland Kitchen testosterone cypionate (DEPOTESTOTERONE CYPIONATE) 200 MG/ML injection As directed       . warfarin (COUMADIN) 4 MG tablet Take 4 mg by mouth daily.      Marland Kitchen DISCONTD: isosorbide mononitrate (IMDUR) 60 MG 24 hr tablet take 1 tablet by mouth once daily  90 tablet  2     Past Medical History  Diagnosis Date  . CAD (coronary artery disease)     Catheterization in November 2008. He had a long restenotic area, 95% stenosis in the previously placed LAD stent. The distal LAD had 30-40% stenosis. Intermediate had 30-40% stenosis. The circumflex had 40% stenosis in  obtuse marginal. The right coronary artery had 30%  followed by 70-8-% stenosis followed by total occlusion. There were left to right collaterals. He had stenting with a Taxus   . CAD (coronary artery disease)     stent in the LAD. The Ef was 65%  . Paroxysmal atrial fibrillation     On chronic Coumadin Therapy  . Transient ischemic attack     Questionable  . Sleep apnea   . Prostate cancer     Post radiation  . Diabetes mellitus   . Hypertension   . Dyslipidemia   . Renal insufficiency     chronic  . TIA (transient ischemic attack)   . Myocardial infarction   . Angina   . Stroke     hx of TIA  . Shortness of breath   . GERD (gastroesophageal reflux disease)   . Arthritis     ROS:   All systems reviewed and negative except as noted in the HPI.   Past Surgical History  Procedure Date  . Esophagogastric fundoplication   . Carotid stent Feb. 2011    Right   . A flutter ablation   . Hernia repair   . Cataracts      No  family history on file.   History   Social History  . Marital Status: Single    Spouse Name: N/A    Number of Children: 2  . Years of Education: N/A   Occupational History  . Not on file.   Social History Main Topics  . Smoking status: Never Smoker   . Smokeless tobacco: Never Used  . Alcohol Use: Yes     1-1/2 Ounces of beer at night  . Drug Use: No  . Sexually Active: No   Other Topics Concern  . Not on file   Social History Narrative  . No narrative on file     BP 130/68  Pulse 65  Ht 5\' 10"  (1.778 m)  Wt 206 lb 12.8 oz (93.804 kg)  BMI 29.67 kg/m2  Physical Exam:  Well appearing  elderly man, NAD HEENT: Unremarkable Neck:  No JVD, no thyromegally Lungs:  Clear with no wheezes, rales, or rhonchi.  HEART:  Regular rate rhythm, no murmurs, no rubs, no clicks Abd:  soft, positive bowel sounds, no organomegally, no rebound, no guarding Ext:  2 plus pulses, no edema, no cyanosis, no clubbing Skin:  No rashes no nodules Neuro:  CN II through XII intact, motor grossly  intact  EKG Normal sinus rhythm with first degree AV block  Assess/Plan:

## 2012-04-07 NOTE — Assessment & Plan Note (Signed)
He is maintaining sinus rhythm after catheter ablation. Because of the remote history of atrial fibrillation and because of his advanced age, I have recommended that he stay on low-dose amiodarone for 6 months. I plan to see him back at that time and stop his amiodarone.

## 2012-04-07 NOTE — Patient Instructions (Signed)
Your physician wants you to follow-up in: 6 months with Dr Taylor You will receive a reminder letter in the mail two months in advance. If you don't receive a letter, please call our office to schedule the follow-up appointment.  

## 2012-04-07 NOTE — Assessment & Plan Note (Signed)
He is currently asymptomatic and there is no evidence of any recurrent atrial fibrillation. EKG today demonstrates a normal QT. I plan to keep him on amiodarone for 6 months. At that time we will stop the amiodarone and he will undergo watchful waiting.

## 2012-04-11 ENCOUNTER — Other Ambulatory Visit: Payer: Self-pay | Admitting: *Deleted

## 2012-04-11 MED ORDER — AMIODARONE HCL 200 MG PO TABS: 200.0000 mg | ORAL_TABLET | Freq: Every day | ORAL | Status: DC

## 2012-05-01 ENCOUNTER — Other Ambulatory Visit: Payer: Self-pay | Admitting: *Deleted

## 2012-05-01 ENCOUNTER — Telehealth: Payer: Self-pay | Admitting: Internal Medicine

## 2012-05-01 MED ORDER — ISOSORBIDE MONONITRATE ER 30 MG PO TB24: 30.0000 mg | ORAL_TABLET | Freq: Every day | ORAL | Status: DC

## 2012-05-01 NOTE — Telephone Encounter (Signed)
C/o itching  face/neck/head off and on for over a month. No welts, no rash, denies throat itching. Stopped using Cpap thinking it was contributing but itching continues. Pt wants to hold his amiodarone for awhile to see if that med is causing the problem, please advise, told pt he will hear back by 3 pm.  Will forward to [place] working with dr Ladona Ridgel today.

## 2012-05-01 NOTE — Telephone Encounter (Signed)
New msg Pt has been having allergic reaction to cordarone. Pt said he was itching last night. Please call

## 2012-05-01 NOTE — Telephone Encounter (Signed)
Dr. Ladona Ridgel reviewed recommendations to stop Amiodarone. Pt aware and will stop today will call back if itching ceases.  Refill for Imdur sent per pt request Mylo Red RN

## 2012-07-18 ENCOUNTER — Encounter: Payer: Self-pay | Admitting: Internal Medicine

## 2012-10-31 ENCOUNTER — Ambulatory Visit (INDEPENDENT_AMBULATORY_CARE_PROVIDER_SITE_OTHER): Payer: Medicare Other | Admitting: Internal Medicine

## 2012-10-31 ENCOUNTER — Encounter: Payer: Self-pay | Admitting: Internal Medicine

## 2012-10-31 VITALS — BP 141/68 | HR 57 | Ht 71.0 in | Wt 201.0 lb

## 2012-10-31 DIAGNOSIS — I4891 Unspecified atrial fibrillation: Secondary | ICD-10-CM

## 2012-10-31 DIAGNOSIS — I4892 Unspecified atrial flutter: Secondary | ICD-10-CM

## 2012-10-31 NOTE — Patient Instructions (Signed)
Your physician wants you to follow-up in: 6 months with Dr Taylor You will receive a reminder letter in the mail two months in advance. If you don't receive a letter, please call our office to schedule the follow-up appointment.  

## 2012-11-01 ENCOUNTER — Encounter: Payer: Self-pay | Admitting: Internal Medicine

## 2012-11-01 NOTE — Assessment & Plan Note (Signed)
He has had no recurrence. Will continue current meds.

## 2012-11-01 NOTE — Progress Notes (Signed)
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 He is a pleasant 79 yo man with a h/o atrial flutter s/p ablation, HTN, dyslipidemia, and CAD. The patient was seen by me several months ago and I had planned to continue amiodarone. He stopped this medication 5 months ago. He has had no recurrent symptomatic arrhythmias. No syncope. He feels well.  No Known Allergies   Current Outpatient Prescriptions  Medication Sig Dispense Refill  . amLODipine (NORVASC) 2.5 MG tablet Take 2.5 mg by mouth daily.        Marland Kitchen b complex vitamins capsule Take 1 capsule by mouth daily.      . carvedilol (COREG) 3.125 MG tablet Take 1 tablet (3.125 mg total) by mouth 2 (two) times daily with a meal.  60 tablet  6  . dorzolamide-timolol (COSOPT) 22.3-6.8 MG/ML ophthalmic solution Place 1 drop into both eyes 2 (two) times daily.      . hydrochlorothiazide 25 MG tablet Take 25 mg by mouth daily.        . isosorbide mononitrate (IMDUR) 30 MG 24 hr tablet Take 1 tablet (30 mg total) by mouth daily.  90 tablet  3  . nitroGLYCERIN (NITROSTAT) 0.4 MG SL tablet Place 0.4 mg under the tongue every 5 (five) minutes as needed.        . Nutritional Supplements (MELATONIN PO) Take 1.5 mg by mouth daily.        . simvastatin (ZOCOR) 40 MG tablet Take 40 mg by mouth at bedtime.        Marland Kitchen testosterone cypionate (DEPOTESTOTERONE CYPIONATE) 200 MG/ML injection As directed       . warfarin (COUMADIN) 4 MG tablet Take 4 mg by mouth daily.         Past Medical History  Diagnosis Date  . CAD (coronary artery disease)     Catheterization in November 2008. He had a long restenotic area, 95% stenosis in the previously placed LAD stent. The distal LAD had 30-40% stenosis. Intermediate had 30-40% stenosis. The circumflex had 40% stenosis in  obtuse marginal. The right coronary artery had 30% followed by 70-8-% stenosis followed by total occlusion. There were left to right collaterals. He had stenting with a Taxus   . CAD (coronary artery disease)    stent in the LAD. The Ef was 65%  . Paroxysmal atrial fibrillation     On chronic Coumadin Therapy  . Transient ischemic attack     Questionable  . Sleep apnea   . Prostate cancer     Post radiation  . Diabetes mellitus   . Hypertension   . Dyslipidemia   . Renal insufficiency     chronic  . TIA (transient ischemic attack)   . Myocardial infarction   . Angina   . Stroke     hx of TIA  . Shortness of breath   . GERD (gastroesophageal reflux disease)   . Arthritis     ROS:   All systems reviewed and negative except as noted in the HPI.   Past Surgical History  Procedure Date  . Esophagogastric fundoplication   . Carotid stent Feb. 2011    Right   . A flutter ablation   . Hernia repair   . Cataracts      No family history on file.   History   Social History  . Marital Status: Single    Spouse Name: N/A    Number of Children: 2  . Years of Education: N/A   Occupational  History  . Not on file.   Social History Main Topics  . Smoking status: Never Smoker   . Smokeless tobacco: Never Used  . Alcohol Use: Yes     Comment: 1-1/2 Ounces of beer at night  . Drug Use: No  . Sexually Active: No   Other Topics Concern  . Not on file   Social History Narrative  . No narrative on file     BP 141/68  Pulse 57  Ht 5\' 11"  (1.803 m)  Wt 201 lb (91.173 kg)  BMI 28.03 kg/m2  Physical Exam:  Well appearing elderly man, NAD HEENT: Unremarkable Neck:  7 cm JVD, no thyromegally Lungs:  Clear with no wheezes, rales or rhonchi HEART:  Regular rate rhythm, no murmurs, no rubs, no clicks Abd:  soft, positive bowel sounds, no organomegally, no rebound, no guarding Ext:  2 plus pulses, no edema, no cyanosis, no clubbing Skin:  No rashes no nodules Neuro:  CN II through XII intact, motor grossly intact  EKG NSR with first degree AV block.   Assess/Plan:

## 2012-11-01 NOTE — Assessment & Plan Note (Signed)
He has had no arrhythmias. No change in medical therapy.

## 2013-01-01 ENCOUNTER — Other Ambulatory Visit: Payer: Self-pay | Admitting: Cardiology

## 2013-01-24 ENCOUNTER — Telehealth: Payer: Self-pay | Admitting: Internal Medicine

## 2013-01-24 NOTE — Telephone Encounter (Signed)
Spoke with patient and his HR is 43 and he is not feeling great.  He has checked it ALL day today at his carotid for an minute and gets the same reading each time.  He does say it is skipping some.  I have discussed with Dr Johney Frame , He is going to hold his Coreg for now and I will call him back on Thurs to see if his HR has improved.  If not better will bring in for an EKG

## 2013-01-24 NOTE — Telephone Encounter (Signed)
New problem    C/O heart rate is  44.  Weakness, does not feel good.

## 2013-01-26 NOTE — Telephone Encounter (Addendum)
Spoke with patient and his HR is 50 and he is feeling some better  Will stay off the Coreg for now and i will check back with him at the beginning of the week

## 2013-01-31 NOTE — Telephone Encounter (Signed)
Spoke with patient's girlfriend and he is much better after stopping Carvedilol

## 2013-02-21 ENCOUNTER — Telehealth: Payer: Self-pay | Admitting: Internal Medicine

## 2013-02-21 NOTE — Telephone Encounter (Signed)
Spoke with patient and after stopping Coreg his HR came up and was in the 50's.  He has felt good until the last week he got a cold and has not been able to get rid of it.  He decided to check his HR today and it is 46.  I have discussed with Dr Ladona Ridgel.  We do not have any other medication to stop at this point.  We are going to see him in clinic on 03/06/13 at 1:30 ( I offered 03/01/13 but he could not come then).  He is aware if his symptoms become worse he should go to the ER

## 2013-02-21 NOTE — Telephone Encounter (Signed)
New problem   Pt is having problems with heart rate. Pt was very anxious. Please call pt

## 2013-02-22 ENCOUNTER — Inpatient Hospital Stay (HOSPITAL_COMMUNITY)
Admission: EM | Admit: 2013-02-22 | Discharge: 2013-02-24 | DRG: 244 | Disposition: A | Discharge status: Home / Self Care | Payer: Medicare Other | Attending: Cardiology | Admitting: Cardiology

## 2013-02-22 ENCOUNTER — Emergency Department (HOSPITAL_COMMUNITY): Payer: Medicare Other

## 2013-02-22 ENCOUNTER — Encounter (HOSPITAL_COMMUNITY): Payer: Self-pay | Admitting: Cardiology

## 2013-02-22 ENCOUNTER — Other Ambulatory Visit: Payer: Self-pay

## 2013-02-22 ENCOUNTER — Telehealth: Payer: Self-pay | Admitting: Internal Medicine

## 2013-02-22 DIAGNOSIS — Z950 Presence of cardiac pacemaker: Secondary | ICD-10-CM

## 2013-02-22 DIAGNOSIS — Z923 Personal history of irradiation: Secondary | ICD-10-CM

## 2013-02-22 DIAGNOSIS — Z9861 Coronary angioplasty status: Secondary | ICD-10-CM

## 2013-02-22 DIAGNOSIS — I2510 Atherosclerotic heart disease of native coronary artery without angina pectoris: Secondary | ICD-10-CM

## 2013-02-22 DIAGNOSIS — I441 Atrioventricular block, second degree: Secondary | ICD-10-CM | POA: Diagnosis present

## 2013-02-22 DIAGNOSIS — E785 Hyperlipidemia, unspecified: Secondary | ICD-10-CM

## 2013-02-22 DIAGNOSIS — I252 Old myocardial infarction: Secondary | ICD-10-CM

## 2013-02-22 DIAGNOSIS — Z8546 Personal history of malignant neoplasm of prostate: Secondary | ICD-10-CM

## 2013-02-22 DIAGNOSIS — E119 Type 2 diabetes mellitus without complications: Secondary | ICD-10-CM | POA: Diagnosis present

## 2013-02-22 DIAGNOSIS — Z7901 Long term (current) use of anticoagulants: Secondary | ICD-10-CM

## 2013-02-22 DIAGNOSIS — K219 Gastro-esophageal reflux disease without esophagitis: Secondary | ICD-10-CM | POA: Diagnosis present

## 2013-02-22 DIAGNOSIS — I498 Other specified cardiac arrhythmias: Principal | ICD-10-CM | POA: Diagnosis present

## 2013-02-22 DIAGNOSIS — Z79899 Other long term (current) drug therapy: Secondary | ICD-10-CM

## 2013-02-22 DIAGNOSIS — I4892 Unspecified atrial flutter: Secondary | ICD-10-CM | POA: Diagnosis present

## 2013-02-22 DIAGNOSIS — I4819 Other persistent atrial fibrillation: Secondary | ICD-10-CM

## 2013-02-22 DIAGNOSIS — I10 Essential (primary) hypertension: Secondary | ICD-10-CM

## 2013-02-22 DIAGNOSIS — I4891 Unspecified atrial fibrillation: Secondary | ICD-10-CM | POA: Diagnosis present

## 2013-02-22 DIAGNOSIS — R001 Bradycardia, unspecified: Secondary | ICD-10-CM

## 2013-02-22 HISTORY — DX: Unspecified atrial flutter: I48.92

## 2013-02-22 HISTORY — DX: Paroxysmal atrial fibrillation: I48.0

## 2013-02-22 HISTORY — DX: Atrioventricular block, second degree: I44.1

## 2013-02-22 LAB — CBC
HCT: 48.6 % (ref 39.0–52.0)
Hemoglobin: 17.1 g/dL — ABNORMAL HIGH (ref 13.0–17.0)
MCH: 32.9 pg (ref 26.0–34.0)
MCHC: 35.2 g/dL (ref 30.0–36.0)
MCV: 93.6 fL (ref 78.0–100.0)
Platelets: 95 10*3/uL — ABNORMAL LOW (ref 150–400)
RBC: 5.19 MIL/uL (ref 4.22–5.81)
RDW: 14.3 % (ref 11.5–15.5)
WBC: 8.4 10*3/uL (ref 4.0–10.5)

## 2013-02-22 LAB — POCT I-STAT TROPONIN I: Troponin i, poc: 0.03 ng/mL (ref 0.00–0.08)

## 2013-02-22 LAB — GLUCOSE, CAPILLARY: Glucose-Capillary: 205 mg/dL — ABNORMAL HIGH (ref 70–99)

## 2013-02-22 LAB — BASIC METABOLIC PANEL
BUN: 34 mg/dL — ABNORMAL HIGH (ref 6–23)
CO2: 23 mEq/L (ref 19–32)
Calcium: 9.5 mg/dL (ref 8.4–10.5)
Chloride: 102 mEq/L (ref 96–112)
Creatinine, Ser: 1.24 mg/dL (ref 0.50–1.35)
GFR calc Af Amer: 58 mL/min — ABNORMAL LOW (ref >90)
GFR calc non Af Amer: 50 mL/min — ABNORMAL LOW (ref >90)
Glucose, Bld: 128 mg/dL — ABNORMAL HIGH (ref 70–99)
Potassium: 4.2 mEq/L (ref 3.5–5.1)
Sodium: 136 mEq/L (ref 135–145)

## 2013-02-22 LAB — PROTIME-INR: INR: 1.79 — ABNORMAL HIGH (ref 0.00–1.49)

## 2013-02-22 MED ORDER — SIMVASTATIN 40 MG PO TABS
40.0000 mg | ORAL_TABLET | Freq: Every day | ORAL | Status: DC
  Administered 2013-02-22: 40 mg via ORAL
  Filled 2013-02-22 (×2): qty 1

## 2013-02-22 MED ORDER — LATANOPROST 0.005 % OP SOLN
1.0000 [drp] | Freq: Every day | OPHTHALMIC | Status: DC
  Administered 2013-02-22 – 2013-02-23 (×2): 1 [drp] via OPHTHALMIC
  Filled 2013-02-22: qty 2.5

## 2013-02-22 MED ORDER — ONDANSETRON HCL 4 MG/2ML IJ SOLN: 4.0000 mg | Freq: Four times a day (QID) | INTRAMUSCULAR | Status: DC | PRN

## 2013-02-22 MED ORDER — ATROPINE SULFATE 1 MG/ML IJ SOLN
INTRAMUSCULAR | Status: AC
  Filled 2013-02-22: qty 1

## 2013-02-22 MED ORDER — ATROPINE SULFATE 0.1 MG/ML IJ SOLN
0.5000 mg | Freq: Once | INTRAMUSCULAR | Status: AC
  Administered 2013-02-22: 0.5 mg via INTRAVENOUS

## 2013-02-22 MED ORDER — SODIUM CHLORIDE 0.9 % IV SOLN: 250.0000 mL | INTRAVENOUS | Status: DC | PRN

## 2013-02-22 MED ORDER — ATROPINE SULFATE 1 MG/ML IJ SOLN: 0.5000 mg | Freq: Once | INTRAMUSCULAR | Status: DC

## 2013-02-22 MED ORDER — ATROPINE SULFATE 0.1 MG/ML IJ SOLN: 1.0000 mg | Freq: Once | INTRAMUSCULAR | Status: DC

## 2013-02-22 MED ORDER — DORZOLAMIDE HCL-TIMOLOL MAL 2-0.5 % OP SOLN
1.0000 [drp] | Freq: Two times a day (BID) | OPHTHALMIC | Status: DC
  Administered 2013-02-23 – 2013-02-24 (×3): 1 [drp] via OPHTHALMIC
  Filled 2013-02-22: qty 10

## 2013-02-22 MED ORDER — ATROPINE SULFATE 1 MG/ML IJ SOLN
0.5000 mg | Freq: Once | INTRAMUSCULAR | Status: DC
  Filled 2013-02-22: qty 1

## 2013-02-22 MED ORDER — WARFARIN SODIUM 5 MG PO TABS
5.0000 mg | ORAL_TABLET | Freq: Once | ORAL | Status: AC
  Administered 2013-02-22: 5 mg via ORAL
  Filled 2013-02-22: qty 1

## 2013-02-22 MED ORDER — HEPARIN (PORCINE) IN NACL 100-0.45 UNIT/ML-% IJ SOLN
1250.0000 [IU]/h | INTRAMUSCULAR | Status: DC
  Administered 2013-02-22: 1250 [IU]/h via INTRAVENOUS
  Filled 2013-02-22 (×2): qty 250

## 2013-02-22 MED ORDER — SODIUM CHLORIDE 0.9 % IV SOLN
INTRAVENOUS | Status: DC
  Administered 2013-02-22: 21:00:00 via INTRAVENOUS

## 2013-02-22 MED ORDER — SODIUM CHLORIDE 0.9 % IJ SOLN: 3.0000 mL | INTRAMUSCULAR | Status: DC | PRN

## 2013-02-22 MED ORDER — HYDROCHLOROTHIAZIDE 25 MG PO TABS
25.0000 mg | ORAL_TABLET | Freq: Every day | ORAL | Status: DC
  Administered 2013-02-23 – 2013-02-24 (×2): 25 mg via ORAL
  Filled 2013-02-22 (×2): qty 1

## 2013-02-22 MED ORDER — SODIUM CHLORIDE 0.9 % IJ SOLN
3.0000 mL | Freq: Two times a day (BID) | INTRAMUSCULAR | Status: DC
  Administered 2013-02-22: 3 mL via INTRAVENOUS

## 2013-02-22 MED ORDER — WARFARIN - PHARMACIST DOSING INPATIENT: Freq: Every day | Status: DC

## 2013-02-22 MED ORDER — ISOSORBIDE MONONITRATE ER 30 MG PO TB24
30.0000 mg | ORAL_TABLET | Freq: Every day | ORAL | Status: DC
  Administered 2013-02-23 – 2013-02-24 (×2): 30 mg via ORAL
  Filled 2013-02-22 (×2): qty 1

## 2013-02-22 MED ORDER — ACETAMINOPHEN 325 MG PO TABS: 650.0000 mg | ORAL_TABLET | Freq: Four times a day (QID) | ORAL | Status: DC | PRN

## 2013-02-22 MED ORDER — AMLODIPINE BESYLATE 2.5 MG PO TABS
2.5000 mg | ORAL_TABLET | Freq: Every day | ORAL | Status: DC
  Administered 2013-02-22 – 2013-02-23 (×2): 2.5 mg via ORAL
  Filled 2013-02-22 (×4): qty 1

## 2013-02-22 MED ORDER — NITROGLYCERIN 0.4 MG SL SUBL: 0.4000 mg | SUBLINGUAL_TABLET | SUBLINGUAL | Status: DC | PRN

## 2013-02-22 NOTE — ED Notes (Signed)
Pt reports he has had a cold for the past couple of days. States he has been lethargic and wanting to sleep a lot. States he had an ablation done about a year ago for low heart rate. Reports hx of MI and stent placement. Denies any chest pain or SOB at this time.

## 2013-02-22 NOTE — ED Notes (Addendum)
Brion Aliment MD with Cardiology at bedside.

## 2013-02-22 NOTE — Telephone Encounter (Signed)
New Prob    Pt has some questions regarding some heart rhythm he is experiencing. Would like to speak to nurse.

## 2013-02-22 NOTE — ED Provider Notes (Signed)
History     CSN: [id number]  Arrival date & time 02/22/13  1620   First MD Initiated Contact with Patient 02/22/13 1736      Chief Complaint  Patient presents with  . Bradycardia    (Consider location/radiation/quality/duration/timing/severity/associated sxs/prior treatment) HPI Comments: 79 year old male, have a cardiac output last year, for atrial flutter.  After that his heart rate was running in the 50s.  He had a cold last week.  Now his heart rate has been running in the low 40s.  He feels weak.  There is no chest pain.  He therefore seeks evaluation.  He called interventional cardiologist, Sharrell Ku, M.D., who advised him to go to [facility] ED for evaluation..  Patient is a 79 y.o. male presenting with general illness. The history is provided by the patient, a significant other and medical records. No language interpreter was used.  Illness  Pertinent negatives include no fever.    Past Medical History  Diagnosis Date  . CAD (coronary artery disease)     Catheterization in November 2008. He had a long restenotic area, 95% stenosis in the previously placed LAD stent. The distal LAD had 30-40% stenosis. Intermediate had 30-40% stenosis. The circumflex had 40% stenosis in  obtuse marginal. The right coronary artery had 30% followed by 70-8-% stenosis followed by total occlusion. There were left to right collaterals. He had stenting with a Taxus   . CAD (coronary artery disease)     stent in the LAD. The Ef was 65%  . Paroxysmal atrial fibrillation     On chronic Coumadin Therapy  . Transient ischemic attack     Questionable  . Sleep apnea   . Prostate cancer     Post radiation  . Diabetes mellitus   . Hypertension   . Dyslipidemia   . Renal insufficiency     chronic  . TIA (transient ischemic attack)   . Myocardial infarction   . Angina   . Stroke     hx of TIA  . Shortness of breath   . GERD (gastroesophageal reflux disease)   . Arthritis     Past Surgical  History  Procedure Laterality Date  . Esophagogastric fundoplication    . Carotid stent  Feb. 2011    Right   . A flutter ablation    . Hernia repair    . Cataracts      History reviewed. No pertinent family history.  History  Substance Use Topics  . Smoking status: Never Smoker   . Smokeless tobacco: Never Used  . Alcohol Use: Yes     Comment: 1-1/2 Ounces of beer at night      Review of Systems  Constitutional: Negative for fever and chills.       Had "cold" over the weekend.  HENT: Negative.   Eyes: Negative.   Respiratory: Negative.   Cardiovascular:       Slow heart rate.  Gastrointestinal: Negative.   Genitourinary: Negative.   Musculoskeletal: Negative.   Skin: Negative.   Neurological: Negative.   Psychiatric/Behavioral: Negative.     Allergies  Review of patient's allergies indicates no known allergies.  Home Medications   Current Outpatient Rx  Name  Route  Sig  Dispense  Refill  . acetaminophen (TYLENOL) 500 MG tablet   Oral   Take 1,000 mg by mouth daily as needed for pain.         Marland Kitchen amLODipine (NORVASC) 2.5 MG tablet   Oral  Take 2.5 mg by mouth every evening.          Marland Kitchen b complex vitamins capsule   Oral   Take 1 capsule by mouth daily.         . dorzolamide-timolol (COSOPT) 22.3-6.8 MG/ML ophthalmic solution   Both Eyes   Place 1 drop into both eyes 2 (two) times daily.         . hydrochlorothiazide 25 MG tablet   Oral   Take 25 mg by mouth daily.          . isosorbide mononitrate (IMDUR) 30 MG 24 hr tablet   Oral   Take 1 tablet (30 mg total) by mouth daily.   90 tablet   3   . latanoprost (XALATAN) 0.005 % ophthalmic solution   Both Eyes   Place 1 drop into both eyes at bedtime.         . Melatonin 3 MG TABS   Oral   Take 3 mg by mouth at bedtime.         . nitroGLYCERIN (NITROSTAT) 0.4 MG SL tablet   Sublingual   Place 0.4 mg under the tongue every 5 (five) minutes as needed for chest pain.          .  simvastatin (ZOCOR) 40 MG tablet   Oral   Take 40 mg by mouth at bedtime.          Marland Kitchen testosterone cypionate (DEPOTESTOTERONE CYPIONATE) 200 MG/ML injection   Intramuscular   Inject 200 mg into the muscle every 21 ( twenty-one) days.          Marland Kitchen warfarin (COUMADIN) 1 MG tablet   Oral   Take 1 mg by mouth every other day. evening         . warfarin (COUMADIN) 4 MG tablet   Oral   Take 4 mg by mouth daily.           BP 115/55  Pulse 48  Temp(Src) 98.7 F (37.1 C) (Oral)  Resp 20  SpO2 97%  Physical Exam  Nursing note and vitals reviewed. Constitutional: He is oriented to person, place, and time. He appears well-developed and well-nourished. No distress.  Bradycardia in low 40's.  HENT:  Head: Normocephalic.  Right Ear: External ear normal.  Left Ear: External ear normal.  Mouth/Throat: Oropharynx is clear and moist.  Eyes: Conjunctivae and EOM are normal. Pupils are equal, round, and reactive to light.  Neck: Normal range of motion. Neck supple.  Cardiovascular: Regular rhythm and normal heart sounds.   Profound bradycardia.  Pulmonary/Chest: Effort normal and breath sounds normal.  Abdominal: Soft. Bowel sounds are normal.  Musculoskeletal: Normal range of motion. He exhibits no edema and no tenderness.  Neurological: He is alert and oriented to person, place, and time.  No sensory or motor deficit.  Skin: Skin is warm and dry.  Psychiatric: He has a normal mood and affect. His behavior is normal.    ED Course  Procedures (including critical care time)   Dg Chest 2 View  02/22/2013  *RADIOLOGY REPORT*  Clinical Data: Bradycardia, hypertension, diabetes, weakness, coronary artery disease post MI  CHEST - 2 VIEW  Comparison: 04/27/2007  Findings: Upper normal heart size. Mediastinal contours and pulmonary vascularity normal. Coronary arterial stent noted. Bronchitic changes without infiltrate, pleural effusion or pneumothorax. Mild bibasilar atelectasis. No  acute osseous findings. Scattered end plate spur formation thoracic spine. Atherosclerotic calcification aortic arch. Right carotid stent noted.  IMPRESSION: Bronchitic changes  with bibasilar atelectasis.   Original Report Authenticated By: Ulyses Southward, M.D.    5:49 PM Pt was seen and had physical exam.  EKG shows a profound bradycardia.  IV atropine ordered for symptomatic bradycardia and transcutaneous pacemaker requested to be placed on pt.    Date: 02/22/2013  Rate: 49  Rhythm: atrial flutter  QRS Axis: left  Intervals: normal QRS:  Poor R wave progression in precordial leads suggest old anterior myocardial infarction.  Left ventricular hypertrophy.  ST/T Wave abnormalities: normal  Conduction Disutrbances:none  Narrative Interpretation: Abnormal EKG  Old EKG Reviewed: changes noted--was in sinus bradycardia on 10/31/2012.  Results for orders placed during the hospital encounter of 02/22/13  CBC      Result Value Range   WBC 8.4  4.0 - 10.5 K/uL   RBC 5.19  4.22 - 5.81 MIL/uL   Hemoglobin 17.1 (*) 13.0 - 17.0 g/dL   HCT 47.8  29.5 - 62.1 %   MCV 93.6  78.0 - 100.0 fL   MCH 32.9  26.0 - 34.0 pg   MCHC 35.2  30.0 - 36.0 g/dL   RDW 30.8  65.7 - 84.6 %   Platelets 95 (*) 150 - 400 K/uL  BASIC METABOLIC PANEL      Result Value Range   Sodium 136  135 - 145 mEq/L   Potassium 4.2  3.5 - 5.1 mEq/L   Chloride 102  96 - 112 mEq/L   CO2 23  19 - 32 mEq/L   Glucose, Bld 128 (*) 70 - 99 mg/dL   BUN 34 (*) 6 - 23 mg/dL   Creatinine, Ser 9.62  0.50 - 1.35 mg/dL   Calcium 9.5  8.4 - 95.2 mg/dL   GFR calc non Af Amer 50 (*) >90 mL/min   GFR calc Af Amer 58 (*) >90 mL/min  PROTIME-INR      Result Value Range   Prothrombin Time 20.2 (*) 11.6 - 15.2 seconds   INR 1.79 (*) 0.00 - 1.49  POCT I-STAT TROPONIN I      Result Value Range   Troponin i, poc 0.03  0.00 - 0.08 ng/mL   Comment 3            Dg Chest 2 View  02/22/2013  *RADIOLOGY REPORT*  Clinical Data: Bradycardia, hypertension,  diabetes, weakness, coronary artery disease post MI  CHEST - 2 VIEW  Comparison: 04/27/2007  Findings: Upper normal heart size. Mediastinal contours and pulmonary vascularity normal. Coronary arterial stent noted. Bronchitic changes without infiltrate, pleural effusion or pneumothorax. Mild bibasilar atelectasis. No acute osseous findings. Scattered end plate spur formation thoracic spine. Atherosclerotic calcification aortic arch. Right carotid stent noted.  IMPRESSION: Bronchitic changes with bibasilar atelectasis.   Original Report Authenticated By: Ulyses Southward, M.D.     5:55 PM Waiting for callback from [facility].  6:08 PM No response to IV atropine.  Case discussed with Valera Castle, M.D. Who will see and admit pt.   1. Symptomatic bradycardia           Carleene Cooper III, MD 02/22/13 1810

## 2013-02-22 NOTE — H&P (Signed)
Patient ID: Shawn Reyes MRN: [id number], DOB/AGE: [birth year]   Admit date: 02/22/2013  Primary Physician: Elmo Putt, MD Primary Cardiologist: Reece Agar. Ladona Ridgel, MD  Pt. Profile:  79 y/o male with h/o CAD, paf (coumadin), and atrial flutter s/p rfca, who presented to ED earlier today with a 1 wk h/o malaise, wkns, and finding of bradycardia today ->aflutter in ED.  Problem List  Past Medical History  Diagnosis Date  . CAD (coronary artery disease)     a. s/p MI;  b. s/p LAD PCI;  c. Cath 09/2007: long restenotic area, 95% stenosis in the previously placed LAD stent. The distal LAD had 30-40% stenosis. Intermediate had 30-40% stenosis. The circumflex had 40% stenosis in  obtuse marginal. The right coronary artery had 30% followed by 70-8-% stenosis followed by total occlusion. There were left to right collaterals. He had PCI of LAD w/ Taxus DES.  Marland Kitchen PAF (paroxysmal atrial fibrillation)     On chronic Coumadin Therapy  . Transient ischemic attack     Questionable  . Sleep apnea   . Prostate cancer     Post radiation  . Diabetes mellitus   . Hypertension   . Dyslipidemia   . Renal insufficiency     chronic  . GERD (gastroesophageal reflux disease)   . Arthritis   . Second degree AV block, Mobitz type I     ecg's 4/20113  . Atrial flutter     a. s/p rfca 01/2012    Past Surgical History  Procedure Laterality Date  . Esophagogastric fundoplication    . Carotid stent  Feb. 2011    Right   . A flutter ablation    . Hernia repair    . Cataracts      Allergies  No Known Allergies  HPI  79 year old male with prior history of paroxysmal atrial fibrillation on chronic Coumadin anticoagulation (see followed by primary care), coronary artery disease status post prior LAD stenting, and atrial flutter status post radiofrequency catheter ablation April 2013. Patient was in his usual state of health until approximately one month ago when he noted general weakness associated with a heart  rate of 43. He was advised to discontinue his carvedilol and after doing so, his heart rate returned to the 50s and he felt better. He did well over the subsequent 3 weeks but approximately one week ago, he developed general malaise and cold like symptoms without cough, congestion, fever, chills. He did not take any over-the-counter remedies and unfortunately he continued to feel weak. He checked his heart rate today and noted to be in the 40s and as a result called our office and was advised to present to the ED for further evaluation. Here, he's been found to be in atrial flutter the heart rate 49. He has not had any chest pain, dyspnea, PND, orthopnea, dizziness, syncope, palpitations, edema, or early satiety. His initial troponin is normal.  Home Medications  Prior to Admission medications   Medication Sig Start Date End Date Taking? Authorizing Provider  acetaminophen (TYLENOL) 500 MG tablet Take 1,000 mg by mouth daily as needed for pain.   Yes Historical Provider, MD  amLODipine (NORVASC) 2.5 MG tablet Take 2.5 mg by mouth every evening.    Yes Historical Provider, MD  b complex vitamins capsule Take 1 capsule by mouth daily.   Yes Historical Provider, MD  dorzolamide-timolol (COSOPT) 22.3-6.8 MG/ML ophthalmic solution Place 1 drop into both eyes 2 (two) times daily.   Yes Historical Provider,  MD  hydrochlorothiazide 25 MG tablet Take 25 mg by mouth daily.    Yes Historical Provider, MD  isosorbide mononitrate (IMDUR) 30 MG 24 hr tablet Take 1 tablet (30 mg total) by mouth daily. 05/01/12  Yes Marinus Maw, MD  latanoprost (XALATAN) 0.005 % ophthalmic solution Place 1 drop into both eyes at bedtime.   Yes Historical Provider, MD  Melatonin 3 MG TABS Take 3 mg by mouth at bedtime.   Yes Historical Provider, MD  nitroGLYCERIN (NITROSTAT) 0.4 MG SL tablet Place 0.4 mg under the tongue every 5 (five) minutes as needed for chest pain.    Yes Historical Provider, MD  simvastatin (ZOCOR) 40 MG tablet  Take 40 mg by mouth at bedtime.    Yes Historical Provider, MD  testosterone cypionate (DEPOTESTOTERONE CYPIONATE) 200 MG/ML injection Inject 200 mg into the muscle every 21 ( twenty-one) days.    Yes Historical Provider, MD  warfarin (COUMADIN) 1 MG tablet Take 1 mg by mouth every other day. evening   Yes Historical Provider, MD  warfarin (COUMADIN) 4 MG tablet Take 4 mg by mouth daily.   Yes Historical Provider, MD   Family History  Family History  Problem Relation Age of Onset  . Other      no early CAD   Social History  History   Social History  . Marital Status: Single    Spouse Name: N/A    Number of Children: 2  . Years of Education: N/A   Occupational History  . doctor     retired physician   Social History Main Topics  . Smoking status: Never Smoker   . Smokeless tobacco: Never Used  . Alcohol Use: Yes     Comment: 1-1/2 Ounces of beer at night  . Drug Use: No  . Sexually Active: No   Other Topics Concern  . Not on file   Social History Narrative   Lives in [place] with "lady friend."    Review of Systems General:  +++weakness/reduced exercise tolerance.  Had cold Ss about 1 week ago - gen malaise.  No chills, fever, night sweats or weight changes.  Cardiovascular:  No chest pain, dyspnea on exertion, edema, orthopnea, palpitations, paroxysmal nocturnal dyspnea. Dermatological: No rash, lesions/masses Respiratory: No cough, dyspnea Urologic: No hematuria, dysuria Abdominal:   No nausea, vomiting, diarrhea, bright red blood per rectum, melena, or hematemesis Neurologic:  No visual changes, wkns, changes in mental status. All other systems reviewed and are otherwise negative except as noted above.  Physical Exam  Blood pressure 165/69, pulse 45, temperature 98.7 F (37.1 C), temperature source Oral, resp. rate 21, SpO2 99.00%.  General: Pleasant, NAD Psych: Normal affect. Neuro: Alert and oriented X 3. Moves all extremities spontaneously. HEENT:  Normal  Neck: Supple without bruits or JVD. Lungs:  Resp regular and unlabored, diminished @ bases, otw cta. Heart: irreg, no s3, s4, or murmurs. Abdomen: Soft, non-tender, non-distended, BS + x 4.  Extremities: No clubbing, cyanosis or edema. DP/PT/Radials 2+ and equal bilaterally.  Labs  Trop i, poc 0.03  Lab Results  Component Value Date   WBC 8.4 02/22/2013   HGB 17.1* 02/22/2013   HCT 48.6 02/22/2013   MCV 93.6 02/22/2013   PLT 95* 02/22/2013     Recent Labs Lab 02/22/13 1650  NA 136  K 4.2  CL 102  CO2 23  BUN 34*  CREATININE 1.24  CALCIUM 9.5  GLUCOSE 128*   Radiology/Studies  Dg Chest 2 View  02/22/2013  *RADIOLOGY REPORT*  Clinical Data: Bradycardia, hypertension, diabetes, weakness, coronary artery disease post MI  CHEST - 2 VIEW  Comparison: 04/27/2007  Findings: Upper normal heart size. Mediastinal contours and pulmonary vascularity normal. Coronary arterial stent noted. Bronchitic changes without infiltrate, pleural effusion or pneumothorax. Mild bibasilar atelectasis. No acute osseous findings. Scattered end plate spur formation thoracic spine. Atherosclerotic calcification aortic arch. Right carotid stent noted.  IMPRESSION: Bronchitic changes with bibasilar atelectasis.   Original Report Authenticated By: Ulyses Southward, M.D.    ECG  Aflutter, variable conduction, 49, poor r prog, no acute st/t changes.  ASSESSMENT AND PLAN  1. Atrial flutter: Patient presents with a 1 week history of general malaise and weakness similar to what he had when he had been in atrial flutter in the past. He had bradycardia about a month ago and at that time his carvedilol was discontinued. Despite this, his heart rate while in atrial flutter is in the 40s and low 50s. He has not had any presyncope or syncope. We will plan to admit him to the hospital and follow closely on telemetry. We'll ask electrophysiology to see him in the morning. It is not clear at this point if he is symptomatic  secondary to recurrence of atrial flutter or bradycardia, however it is noteworthy that while in sinus rhythm his rates were typically in the 50s to 60s, and thus not dramatically different than his rates on the monitor today. He may require repeat ablation and then reevaluation of symptoms to determine whether or not pacing is necessary if he remains bradycardic and weak. Continue to hold beta blocker. Notably, he was on amiodarone in the past but discontinued this at some point last year. Continue Coumadin. His INR is followed by his primary care provider in [place] and is subtherapeutic today. That being the case, if he did require repeat ablation, he would also require TEE or deferral of ablation until INR has been therapeutic for 4 weeks.  2. Coronary artery disease: Patient has not had any recurrence of chest pain or dyspnea. His initial troponin is negative an ECG shows no evidence of new ischemia. Continue home dose of statin therapy. He is not on aspirin in the setting of anticoagulation with Coumadin.  3. Hypertension: Pressures are elevated in the ED. Continue amlodipine.  4.  HL:  Cont statin.  5.  H/O CKD:  Creat is nl.  6.  Diet controlled DM:  Listed in history.  Non-fasting sugar in er is nl.  Follow.  Signed, Nicolasa Ducking, NP 02/22/2013, 7:25 PM I have taken a history, reviewed medications, allergies, PMH, SH, FH, and reviewed ROS and examined the patient.  I agree with the assessment and plan. Suspect will need PPM and either amiodarone or touch up ablation. Fatique seems to be his main complaint and correlates with onset of flutter, not bradycardia.  Glendal Cassaday C. Daleen Squibb, MD, [facility] Pager:  [phone]

## 2013-02-22 NOTE — Telephone Encounter (Signed)
I have talked with patient and he is still feeling bad.  His HR today is in the low 40's.  I have asked the patient to go the ER and be evaluated.  Dr Ladona Ridgel and Rosann Auerbach aware

## 2013-02-22 NOTE — Progress Notes (Signed)
ANTICOAGULATION CONSULT NOTE - Initial Consult  Pharmacy Consult for Heparin Indication: Atrial flutter  No Known Allergies  Patient Measurements: Height: 5\' 11"  (180.3 cm) Weight: 197 lb (89.359 kg) IBW/kg (Calculated) : 75.3 Heparin Dosing Weight: 89.4 kg  Vital Signs: Temp: 98.7 F (37.1 C) (04/24 1632) Temp src: Oral (04/24 1632) BP: 162/77 mmHg (04/24 1945) Pulse Rate: 120 (04/24 1945)  Labs:  Recent Labs  02/22/13 1650  HGB 17.1*  HCT 48.6  PLT 95*  LABPROT 20.2*  INR 1.79*  CREATININE 1.24    Estimated Creatinine Clearance: 43 ml/min (by C-G formula based on Cr of 1.24).   Medical History: Past Medical History  Diagnosis Date  . CAD (coronary artery disease)     a. s/p MI;  b. s/p LAD PCI;  c. Cath 09/2007: long restenotic area, 95% stenosis in the previously placed LAD stent. The distal LAD had 30-40% stenosis. Intermediate had 30-40% stenosis. The circumflex had 40% stenosis in  obtuse marginal. The right coronary artery had 30% followed by 70-8-% stenosis followed by total occlusion. There were left to right collaterals. He had PCI of LAD w/ Taxus DES.  Marland Kitchen PAF (paroxysmal atrial fibrillation)     On chronic Coumadin Therapy  . Transient ischemic attack     Questionable  . Sleep apnea   . Prostate cancer     Post radiation  . Diabetes mellitus   . Hypertension   . Dyslipidemia   . Renal insufficiency     chronic  . GERD (gastroesophageal reflux disease)   . Arthritis   . Second degree AV block, Mobitz type I     ecg's 4/20113  . Atrial flutter     a. s/p rfca 01/2012    Medications:  PTA Warfarin dose was alternating 3 mg and 4 mg daily  Assessment: 79 y.o. M who presented to the [facility] on 02/22/13 with symptomatic bradycardia. The patient has a known history of Afib and was on warfarin PTA -- however had a SUBtherapeutic INR on admission. Pharmacy was consulted to resume the patient's home warfarin and bridge with heparin for hx Afib -- possible  need for re-ablation.  The patient denies any recent bleeding or surgeries. PTA the patient was alternating between warfarin 3 mg and 4 mg daily. INR was 1.79 on admission. Hgb/Hct okay, plts slightly low at 95. Hep Wt: 89.4 kg, CrCl~40 ml/min.   Goal of Therapy:  INR 2-3 Heparin level 0.3-0.7 units/ml Monitor platelets by anticoagulation protocol: Yes   Plan:  1. No heparin bolus 2. Initiate heparin at a drip rate of 1250 units/hr 3. Warfarin 5 mg x 1 dose at 1800 today 4. Daily heparin levels, PT/INR 5. Will continue to monitor for any signs/symptoms of bleeding and will follow up with heparin level in 8 hours and PT/INR in the a.m  Georgina Pillion, PharmD, BCPS Clinical Pharmacist Pager: [phone] 02/22/2013 8:18 PM

## 2013-02-23 ENCOUNTER — Encounter (HOSPITAL_COMMUNITY)
Admission: EM | Disposition: A | Discharge status: Home / Self Care | Payer: Self-pay | Source: Home / Self Care | Attending: Cardiology

## 2013-02-23 ENCOUNTER — Ambulatory Visit (HOSPITAL_COMMUNITY): Admit: 2013-02-23 | Payer: Self-pay | Admitting: Internal Medicine

## 2013-02-23 DIAGNOSIS — I498 Other specified cardiac arrhythmias: Principal | ICD-10-CM

## 2013-02-23 HISTORY — PX: PACEMAKER INSERTION: SHX728

## 2013-02-23 HISTORY — PX: PERMANENT PACEMAKER INSERTION: SHX5480

## 2013-02-23 LAB — PROTIME-INR
INR: 1.78 — ABNORMAL HIGH (ref 0.00–1.49)
Prothrombin Time: 20.1 seconds — ABNORMAL HIGH (ref 11.6–15.2)

## 2013-02-23 LAB — CBC
HCT: 48.2 % (ref 39.0–52.0)
Hemoglobin: 16.6 g/dL (ref 13.0–17.0)
MCH: 32.4 pg (ref 26.0–34.0)
MCHC: 34.4 g/dL (ref 30.0–36.0)
MCV: 94 fL (ref 78.0–100.0)
RDW: 14.4 % (ref 11.5–15.5)

## 2013-02-23 LAB — BASIC METABOLIC PANEL
BUN: 28 mg/dL — ABNORMAL HIGH (ref 6–23)
Calcium: 9 mg/dL (ref 8.4–10.5)
Creatinine, Ser: 1.31 mg/dL (ref 0.50–1.35)
GFR calc Af Amer: 54 mL/min — ABNORMAL LOW (ref >90)
GFR calc non Af Amer: 47 mL/min — ABNORMAL LOW (ref >90)
Glucose, Bld: 111 mg/dL — ABNORMAL HIGH (ref 70–99)
Potassium: 3.6 mEq/L (ref 3.5–5.1)

## 2013-02-23 LAB — SURGICAL PCR SCREEN
MRSA, PCR: NEGATIVE
Staphylococcus aureus: NEGATIVE

## 2013-02-23 LAB — GLUCOSE, CAPILLARY
Glucose-Capillary: 132 mg/dL — ABNORMAL HIGH (ref 70–99)
Glucose-Capillary: 98 mg/dL (ref 70–99)
Glucose-Capillary: 189 mg/dL — ABNORMAL HIGH (ref 70–99)

## 2013-02-23 LAB — TROPONIN I: Troponin I: <0.3 ng/mL (ref <0.30)

## 2013-02-23 SURGERY — PERMANENT PACEMAKER INSERTION
Anesthesia: LOCAL

## 2013-02-23 MED ORDER — SODIUM CHLORIDE 0.9 % IV SOLN: 250.0000 mL | INTRAVENOUS | Status: DC | PRN

## 2013-02-23 MED ORDER — SODIUM CHLORIDE 0.9 % IV SOLN
INTRAVENOUS | Status: DC
  Administered 2013-02-23: 11:00:00 via INTRAVENOUS

## 2013-02-23 MED ORDER — CHLORHEXIDINE GLUCONATE 4 % EX LIQD: 60.0000 mL | Freq: Once | CUTANEOUS | Status: DC

## 2013-02-23 MED ORDER — SODIUM CHLORIDE 0.9 % IJ SOLN: 3.0000 mL | INTRAMUSCULAR | Status: DC | PRN

## 2013-02-23 MED ORDER — ACETAMINOPHEN 325 MG PO TABS: 325.0000 mg | ORAL_TABLET | ORAL | Status: DC | PRN

## 2013-02-23 MED ORDER — LIDOCAINE HCL (PF) 1 % IJ SOLN
INTRAMUSCULAR | Status: AC
  Filled 2013-02-23: qty 60

## 2013-02-23 MED ORDER — ONDANSETRON HCL 4 MG/2ML IJ SOLN: 4.0000 mg | Freq: Four times a day (QID) | INTRAMUSCULAR | Status: DC | PRN

## 2013-02-23 MED ORDER — SODIUM CHLORIDE 0.9 % IR SOLN
80.0000 mg | Status: AC
  Administered 2013-02-23: 80 mg
  Filled 2013-02-23: qty 2

## 2013-02-23 MED ORDER — ATORVASTATIN CALCIUM 20 MG PO TABS
20.0000 mg | ORAL_TABLET | Freq: Every day | ORAL | Status: DC
  Administered 2013-02-23: 20 mg via ORAL
  Filled 2013-02-23 (×3): qty 1

## 2013-02-23 MED ORDER — HYDROCODONE-ACETAMINOPHEN 5-325 MG PO TABS: 1.0000 | ORAL_TABLET | ORAL | Status: DC | PRN

## 2013-02-23 MED ORDER — SODIUM CHLORIDE 0.9 % IJ SOLN: 3.0000 mL | Freq: Two times a day (BID) | INTRAMUSCULAR | Status: DC

## 2013-02-23 MED ORDER — CEFAZOLIN SODIUM 1-5 GM-% IV SOLN
1.0000 g | Freq: Four times a day (QID) | INTRAVENOUS | Status: AC
  Administered 2013-02-23 – 2013-02-24 (×3): 1 g via INTRAVENOUS
  Filled 2013-02-23 (×3): qty 50

## 2013-02-23 MED ORDER — CEFAZOLIN SODIUM-DEXTROSE 2-3 GM-% IV SOLR
2.0000 g | INTRAVENOUS | Status: AC
  Administered 2013-02-23: 2 g via INTRAVENOUS
  Filled 2013-02-23: qty 50

## 2013-02-23 MED ORDER — CHLORHEXIDINE GLUCONATE 4 % EX LIQD: 60.0000 mL | Freq: Once | CUTANEOUS | Status: AC

## 2013-02-23 MED ORDER — WARFARIN SODIUM 5 MG PO TABS
5.0000 mg | ORAL_TABLET | Freq: Once | ORAL | Status: DC
  Filled 2013-02-23: qty 1

## 2013-02-23 MED ORDER — SODIUM CHLORIDE 0.9 % IJ SOLN
3.0000 mL | Freq: Two times a day (BID) | INTRAMUSCULAR | Status: DC
  Administered 2013-02-23: 3 mL via INTRAVENOUS

## 2013-02-23 MED ORDER — CHLORHEXIDINE GLUCONATE 4 % EX LIQD
CUTANEOUS | Status: AC
  Administered 2013-02-23: 11:00:00
  Filled 2013-02-23: qty 30

## 2013-02-23 MED ORDER — ATROPINE SULFATE 1 MG/ML IJ SOLN
INTRAMUSCULAR | Status: AC
  Filled 2013-02-23: qty 1

## 2013-02-23 MED ORDER — MIDAZOLAM HCL 5 MG/5ML IJ SOLN
INTRAMUSCULAR | Status: AC
  Filled 2013-02-23: qty 5

## 2013-02-23 MED ORDER — SODIUM CHLORIDE 0.9 % IV SOLN: 250.0000 mL | INTRAVENOUS | Status: DC

## 2013-02-23 NOTE — Interval H&P Note (Signed)
History and Physical Interval Note:  02/23/2013 10:43 AM  Shawn Reyes  has presented today for surgery, with the diagnosis of bradycardia  The various methods of treatment have been discussed with the patient and family. After consideration of risks, benefits and other options for treatment, the patient has consented to  Procedure(s): PERMANENT PACEMAKER INSERTION (N/A) as a surgical intervention .  The patient's history has been reviewed, patient examined, no change in status, stable for surgery.  I have reviewed the patient's chart and labs.  Questions were answered to the patient's satisfaction.     Hillis Range   The patient has symptomatic bradycardia.  I would therefore recommend pacemaker implantation at this time.  Risks, benefits, alternatives to pacemaker implantation were discussed in detail with the patient today. The patient understands that the risks include but are not limited to bleeding, infection, pneumothorax, perforation, tamponade, vascular damage, renal failure, MI, stroke, death,  and lead dislodgement and wishes to proceed. We will therefore schedule the procedure at the next available time.

## 2013-02-23 NOTE — Progress Notes (Signed)
ANTICOAGULATION CONSULT NOTE - Initial Consult  Pharmacy Consult for warfarin Indication: Atrial flutter/PAF   No Known Allergies  Patient Measurements: Height: 5\' 11"  (180.3 cm) Weight: 195 lb 12.3 oz (88.8 kg) IBW/kg (Calculated) : 75.3 Heparin Dosing Weight: 89.4 kg  Vital Signs: Temp: 98.1 F (36.7 C) (04/25 0700) Temp src: Oral (04/25 0700) BP: 163/46 mmHg (04/25 1000)  Labs:  Recent Labs  02/22/13 1650 02/22/13 2157 02/23/13 0321 02/23/13 0550 02/23/13 0811  HGB 17.1*  --  16.6  --   --   HCT 48.6  --  48.2  --   --   PLT 95*  --  89*  --   --   LABPROT 20.2*  --  20.3* 20.1*  --   INR 1.79*  --  1.81* 1.78*  --   HEPARINUNFRC  --   --   --  0.47  --   CREATININE 1.24  --  1.31  --   --   TROPONINI  --  <0.30 <0.30  --  <0.30    Estimated Creatinine Clearance: 40.7 ml/min (by C-G formula based on Cr of 1.31).  Medications:  PTA Warfarin dose was alternating 3 mg and 4 mg daily  Assessment: 79 y.o. M who presented to the [facility] on 02/22/13 with symptomatic bradycardia. The patient has a known history of Afib and was on warfarin PTA -- however had a SUBtherapeutic INR on admission. Pharmacy was consulted to resume the patient's home warfarin and bridge with heparin for hx Afib.  The patient denies any recent bleeding or surgeries. PTA the patient was alternating between warfarin 3 mg and 4 mg daily.   INR this morning 1.7, heparin stopped prior to pacemaker insertion.   Goal of Therapy:  INR 2-3 Heparin level 0.3-0.7 units/ml Monitor platelets by anticoagulation protocol: Yes   Plan:  1.Repeat Warfarin 5 mg x 1 dose at 1800 today 4. Daily heparin levels, PT/INR 5. Will continue to monitor for any signs/symptoms of bleeding and will follow up post procedure and PT/INR in the a.m  Sheppard Coil PharmD., BCPS Clinical Pharmacist Pager [phone] 02/23/2013 10:48 AM

## 2013-02-23 NOTE — Progress Notes (Signed)
Utilization review completed. Lynnda Wiersma, RN, BSN. 

## 2013-02-23 NOTE — Clinical Documentation Improvement (Signed)
CKD DOCUMENTATION CLARIFICATION QUERY   Please update your documentation within the medical record to reflect your response to this query.                                                                                         02/23/13   Dear Dr. Lewayne Bunting,   In a better effort to capture your patient's severity of illness, reflect appropriate length of stay and utilization of resources, a review of the patient medical record has revealed the following indicators.    Based on your clinical judgment, please clarify and document in a progress note and/or discharge summary the clinical condition associated with the following supporting information:   Your documentation in the H&P that the patient has a history of chronic renal insufficiency.    In responding to this query please exercise your independent judgment.  The fact that a query is asked, does not imply that any particular answer is desired or expected.  Possible Clinical Conditions?   _______CKD Stage I -  GFR > OR = 90 _______CKD Stage II - GFR 60-80 _____x__CKD Stage III - GFR 30-59 _______CKD Stage IV - GFR 15-29 _______CKD Stage V - GFR < 15 _______ESRD (End Stage Renal Disease) _______Other condition_____________   Calculated GFR:  On admission, 02/22/2013, GFR was 50 mL/min.  02/23/2013, GFR is 47 mL/min.   You may use possible, probable, or suspect with inpatient documentation.  Possible, probable, suspected diagnoses MUST be documented at the time of discharge.    Reviewed: He has stage 3 renal failure as above. GT   Thank You,  Darla Lesches, RN, BSN, CCRN Clinical Documentation Specialist Pager [phone], Office [phone]  HIM department [facility]

## 2013-02-23 NOTE — Consult Note (Signed)
  ELECTROPHYSIOLOGY CONSULT NOTE    Patient ID: Shawn Reyes MRN: [id number], DOB/AGE: 05/29/1921 79 y.o.  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  He had been maintained on Amiodarone post ablation, but had discontinued this medication in July of 2013.  A few weeks ago, the patient began to notice exercise intolerance and fatigue and called the office.  His Coreg was discontinued at that time.  His heart rate maintained in the 50's; however, he has had persistent fatigue and exercise intolerance with bradycardia.  He called the office yesterday and was advised to come to the ER for further evaluation.  He was found to be in atrial fibrillation with a slow ventricular response in the 40's.  Overnight, his heart rate has remained in the 40's.  He is currently not on any AV nodal blocking agents.  No reversible causes have been found for his bradycardia.   Last echo in 2011 demonstrated an LA dimension of 4cm and an EF of >55%.   He is anticoagulated with Warfarin, but was subtherapeutic on admission.  He is currently being bridged with Heparin.    Other past medical history is significant for CAD, prostate ca (s/p radiation), diabetes, hypertension, hyperlipidemia, and chronic renal insufficiency.   ROS is negative except as outlined above.   Past Medical History  Diagnosis Date  . CAD (coronary artery disease)     a. s/p MI;  b. s/p LAD PCI;  c. Cath 09/2007: long restenotic area, 95% stenosis in the previously placed LAD stent. The distal LAD had 30-40% stenosis. Intermediate had 30-40% stenosis. The circumflex had 40% stenosis in  obtuse marginal. The right coronary artery had 30% followed by 70-8-% stenosis followed by total occlusion.  There were left to right collaterals. He had PCI of LAD w/ Taxus DES.  . PAF (paroxysmal atrial fibrillation)     On chronic Coumadin Therapy  . Transient ischemic attack     Questionable  . Sleep apnea   . Prostate cancer     Post radiation  . Diabetes mellitus   . Hypertension   . Dyslipidemia   . Renal insufficiency     chronic  . GERD (gastroesophageal reflux disease)   . Arthritis   . Second degree AV block, Mobitz type I     ecg's 4/20113  . Atrial flutter     a. s/p rfca 01/2012     Surgical History:  Past Surgical History  Procedure Laterality Date  . Esophagogastric fundoplication    . Carotid stent  Feb. 2011    Right   . A flutter ablation    . Hernia repair    . Cataracts       Prescriptions prior to admission  Medication Sig Dispense Refill  . acetaminophen (TYLENOL) 500 MG tablet Take 1,000 mg by mouth daily as needed for pain.      . amLODipine (NORVASC) 2.5 MG tablet Take 2.5 mg by mouth every evening.       . b complex vitamins capsule Take 1 capsule by mouth daily.      . dorzolamide-timolol (COSOPT) 22.3-6.8 MG/ML ophthalmic solution Place 1 drop into both eyes 2 (two) times daily.      . hydrochlorothiazide 25 MG tablet   Take 25 mg by mouth daily.       . isosorbide mononitrate (IMDUR) 30 MG 24 hr tablet Take 1 tablet (30 mg total) by mouth daily.  90 tablet  3  . latanoprost (XALATAN) 0.005 % ophthalmic solution Place 1 drop into both eyes at bedtime.      . Melatonin 3 MG TABS Take 3 mg by mouth at bedtime.      . nitroGLYCERIN (NITROSTAT) 0.4 MG SL tablet Place 0.4 mg under the tongue every 5 (five) minutes as needed for chest pain.       . simvastatin (ZOCOR) 40 MG tablet Take 40 mg by mouth at bedtime.       . testosterone cypionate (DEPOTESTOTERONE CYPIONATE) 200 MG/ML injection Inject 200 mg into the muscle every 21 ( twenty-one) days.       . warfarin (COUMADIN) 1 MG tablet Take 1 mg by mouth every other day. Takes every other evening with a half  of a 4 mg tablet to equal a total dose of 3 mg      . warfarin (COUMADIN) 4 MG tablet Take 2-4 mg by mouth every evening. Alternates between 3 mg and 4 mg daily. For the 3 mg dose, he takes 1/2 tab of the 4 mg tablet along with a 1 mg tablet.        Inpatient Medications:  . amLODipine  2.5 mg Oral QHS  . atropine      . dorzolamide-timolol  1 drop Both Eyes BID  . hydrochlorothiazide  25 mg Oral Daily  . isosorbide mononitrate  30 mg Oral Daily  . latanoprost  1 drop Both Eyes QHS  . simvastatin  40 mg Oral QHS  . sodium chloride  3 mL Intravenous Q12H  . Warfarin - Pharmacist Dosing Inpatient   Does not apply q1800    Allergies: No Known Allergies  History   Social History  . Marital Status: Single    Spouse Name: N/A    Number of Children: 2  . Years of Education: N/A   Occupational History  . doctor     retired physician   Social History Main Topics  . Smoking status: Never Smoker   . Smokeless tobacco: Never Used  . Alcohol Use: Yes     Comment: 6 Ounces of beer at night  . Drug Use: No  . Sexually Active: No   Other Topics Concern  . Not on file   Social History Narrative   Lives in [place] with "lady friend."     Family History  Problem Relation Age of Onset  . Other      no early CAD    Physical Exam  stable appearing elderly man, NAD HEENT: Unremarkable Neck:  9 cm JVD, no thyromegally Back:  No CVA tenderness Lungs:  Clear with scattered rales HEART:  IRegular brady rhythm, no murmurs, no rubs, no clicks Abd:  soft, positive bowel sounds, no organomegally, no rebound, no guarding Ext:  2 plus pulses, no edema, no cyanosis, no clubbing Skin:  No rashes no nodules Neuro:  CN II through XII intact, motor grossly intact   Labs:   Lab Results  Component Value Date   WBC 7.8 02/23/2013   HGB 16.6 02/23/2013   HCT 48.2 02/23/2013   MCV 94.0 02/23/2013   PLT 89* 02/23/2013    Recent Labs Lab 02/23/13 0321  NA 136  K 3.6  CL 100  CO2 27  BUN  28*  CREATININE 1.31    CALCIUM 9.0  GLUCOSE 111*    Radiology/Studies: Dg Chest 2 View 02/22/2013  *RADIOLOGY REPORT*  Clinical Data: Bradycardia, hypertension, diabetes, weakness, coronary artery disease post MI  CHEST - 2 VIEW  Comparison: 04/27/2007  Findings: Upper normal heart size. Mediastinal contours and pulmonary vascularity normal. Coronary arterial stent noted. Bronchitic changes without infiltrate, pleural effusion or pneumothorax. Mild bibasilar atelectasis. No acute osseous findings. Scattered end plate spur formation thoracic spine. Atherosclerotic calcification aortic arch. Right carotid stent noted.  IMPRESSION: Bronchitic changes with bibasilar atelectasis.   Original Report Authenticated By: Mark Boles, M.D.     EKG: coarse atrial fibrillation with slow ventricular response  TELEMETRY:  Coarse atrial fibrillation with slow ventricular response  A/P 1. Symptomatic bradycardia 2. PAF 3. H/o atrial flutter s/p ablation 4. CAD Rec: he needs PPM, therapeutic anti-coagulation and re-initiation of amio followed by DCCV. Will attempt to place DDD PPM today.   Gregg Taylor,M.D.   

## 2013-02-23 NOTE — Progress Notes (Signed)
ANTICOAGULATION CONSULT NOTE - Follow Up Consult  Pharmacy Consult for heparin Indication: Aflutter  Labs:  Recent Labs  02/22/13 1650 02/22/13 2157 02/23/13 0321 02/23/13 0550  HGB 17.1*  --  16.6  --   HCT 48.6  --  48.2  --   PLT 95*  --  89*  --   LABPROT 20.2*  --  20.3* 20.1*  INR 1.79*  --  1.81* 1.78*  HEPARINUNFRC  --   --   --  0.47  CREATININE 1.24  --  1.31  --   TROPONINI  --  <0.30 <0.30  --     Assessment/Plan:  79yo male therapeutic on heparin with initial dosing for low INR.  Will continue gtt at current rate and confirm stable with additional level.  Vernard Gambles, PharmD, BCPS  02/23/2013,7:05 AM

## 2013-02-23 NOTE — Op Note (Signed)
SURGEON:  Hillis Range, MD     PREPROCEDURE DIAGNOSIS:  Symptomatic Bradycardia, persistent atrial fibrillation    POSTPROCEDURE DIAGNOSIS:  Symptomatic Bradycardia, persistent atrial fibrillation     PROCEDURES:   1. Pacemaker implantation.     INTRODUCTION: Shawn Reyes is a 79 y.o. male  with a history of bradycardia who presents today for pacemaker implantation.  The patient reports progressive fatigue and decreased exercise tolerance.  He is found to have afib with a slow ventricular rate.  No reversible causes have been identified.  The patient therefore presents today for pacemaker implantation.     DESCRIPTION OF PROCEDURE:  Informed written consent was obtained, and the patient was brought to the electrophysiology lab in a fasting state.  The patient received IV Versed 1mg  as sedation for the procedure today.  The patients left chest was prepped and draped in the usual sterile fashion by the EP lab staff. The skin overlying the left deltopectoral region was infiltrated with lidocaine for local analgesia.  A 4-cm incision was made over the left deltopectoral region.  A left subcutaneous pacemaker pocket was fashioned using a combination of sharp and blunt dissection. Electrocautery was required to assure hemostasis.    RA/RV Lead Placement: The left axillary vein was cannulated. No contrast was required for this endeavor.  Through the left axillary vein, a Public house manager model [id number] (serial number  [id number]) right atrial lead and a [employer] model 1948- 58 (serial number  [id number]) right ventricular lead were advanced with fluoroscopic visualization into the right atrial appendage and right ventricular apex positions respectively.  Initial atrial atrial fibrillation waves measured 2 mV with impedance of 546 ohms.  A threshold could not be performed due to afib.  As the patient has not had therapeutic anticoagulation, I did not perform cardioversion today.  Right ventricular lead  R-waves measured 19 mV with an impedance of 969 ohms and a threshold of 0.4 V at 0.4 msec.  Both leads were secured to the pectoralis fascia using #2-0 silk over the suture sleeves.   Device Placement:  The leads were then connected to a [employer] DR RF model [id number] (serial number  [id number] ) pacemaker.  The pocket was irrigated with copious gentamicin solution.  The pacemaker was then placed into the pocket.  The pocket was then closed in 2 layers with 2.0 Vicryl suture for the subcutaneous and subcuticular layers.  Steri- Strips and a sterile dressing were then applied.  There were no early apparent complications.     CONCLUSIONS:   1. Successful implantation of a Industrial/product designer DR RF dual-chamber pacemaker for symptomatic bradycardia and persistent atrial fibrillation  2. No early apparent complications.           Hillis Range, MD 02/23/2013 5:45 PM

## 2013-02-23 NOTE — H&P (View-Only) (Signed)
ELECTROPHYSIOLOGY CONSULT NOTE    Patient ID: Shawn Reyes MRN: [id number], DOB/AGE: 12-18-1920 79 y.o.  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  He had been maintained on Amiodarone post ablation, but had discontinued this medication in July of 2013.  A few weeks ago, the patient began to notice exercise intolerance and fatigue and called the office.  His Coreg was discontinued at that time.  His heart rate maintained in the 50's; however, he has had persistent fatigue and exercise intolerance with bradycardia.  He called the office yesterday and was advised to come to the ER for further evaluation.  He was found to be in atrial fibrillation with a slow ventricular response in the 40's.  Overnight, his heart rate has remained in the 40's.  He is currently not on any AV nodal blocking agents.  No reversible causes have been found for his bradycardia.   Last echo in 2011 demonstrated an LA dimension of 4cm and an EF of >55%.   He is anticoagulated with Warfarin, but was subtherapeutic on admission.  He is currently being bridged with Heparin.    Other past medical history is significant for CAD, prostate ca (s/p radiation), diabetes, hypertension, hyperlipidemia, and chronic renal insufficiency.   ROS is negative except as outlined above.   Past Medical History  Diagnosis Date  . CAD (coronary artery disease)     a. s/p MI;  b. s/p LAD PCI;  c. Cath 09/2007: long restenotic area, 95% stenosis in the previously placed LAD stent. The distal LAD had 30-40% stenosis. Intermediate had 30-40% stenosis. The circumflex had 40% stenosis in  obtuse marginal. The right coronary artery had 30% followed by 70-8-% stenosis followed by total occlusion.  There were left to right collaterals. He had PCI of LAD w/ Taxus DES.  Marland Kitchen PAF (paroxysmal atrial fibrillation)     On chronic Coumadin Therapy  . Transient ischemic attack     Questionable  . Sleep apnea   . Prostate cancer     Post radiation  . Diabetes mellitus   . Hypertension   . Dyslipidemia   . Renal insufficiency     chronic  . GERD (gastroesophageal reflux disease)   . Arthritis   . Second degree AV block, Mobitz type I     ecg's 4/20113  . Atrial flutter     a. s/p rfca 01/2012     Surgical History:  Past Surgical History  Procedure Laterality Date  . Esophagogastric fundoplication    . Carotid stent  Feb. 2011    Right   . A flutter ablation    . Hernia repair    . Cataracts       Prescriptions prior to admission  Medication Sig Dispense Refill  . acetaminophen (TYLENOL) 500 MG tablet Take 1,000 mg by mouth daily as needed for pain.      Marland Kitchen amLODipine (NORVASC) 2.5 MG tablet Take 2.5 mg by mouth every evening.       Marland Kitchen b complex vitamins capsule Take 1 capsule by mouth daily.      . dorzolamide-timolol (COSOPT) 22.3-6.8 MG/ML ophthalmic solution Place 1 drop into both eyes 2 (two) times daily.      . hydrochlorothiazide 25 MG tablet  Take 25 mg by mouth daily.       . isosorbide mononitrate (IMDUR) 30 MG 24 hr tablet Take 1 tablet (30 mg total) by mouth daily.  90 tablet  3  . latanoprost (XALATAN) 0.005 % ophthalmic solution Place 1 drop into both eyes at bedtime.      . Melatonin 3 MG TABS Take 3 mg by mouth at bedtime.      . nitroGLYCERIN (NITROSTAT) 0.4 MG SL tablet Place 0.4 mg under the tongue every 5 (five) minutes as needed for chest pain.       . simvastatin (ZOCOR) 40 MG tablet Take 40 mg by mouth at bedtime.       Marland Kitchen testosterone cypionate (DEPOTESTOTERONE CYPIONATE) 200 MG/ML injection Inject 200 mg into the muscle every 21 ( twenty-one) days.       Marland Kitchen warfarin (COUMADIN) 1 MG tablet Take 1 mg by mouth every other day. Takes every other evening with a half  of a 4 mg tablet to equal a total dose of 3 mg      . warfarin (COUMADIN) 4 MG tablet Take 2-4 mg by mouth every evening. Alternates between 3 mg and 4 mg daily. For the 3 mg dose, he takes 1/2 tab of the 4 mg tablet along with a 1 mg tablet.        Inpatient Medications:  . amLODipine  2.5 mg Oral QHS  . atropine      . dorzolamide-timolol  1 drop Both Eyes BID  . hydrochlorothiazide  25 mg Oral Daily  . isosorbide mononitrate  30 mg Oral Daily  . latanoprost  1 drop Both Eyes QHS  . simvastatin  40 mg Oral QHS  . sodium chloride  3 mL Intravenous Q12H  . Warfarin - Pharmacist Dosing Inpatient   Does not apply q1800    Allergies: No Known Allergies  History   Social History  . Marital Status: Single    Spouse Name: N/A    Number of Children: 2  . Years of Education: N/A   Occupational History  . doctor     retired physician   Social History Main Topics  . Smoking status: Never Smoker   . Smokeless tobacco: Never Used  . Alcohol Use: Yes     Comment: 6 Ounces of beer at night  . Drug Use: No  . Sexually Active: No   Other Topics Concern  . Not on file   Social History Narrative   Lives in [place] with "lady friend."     Family History  Problem Relation Age of Onset  . Other      no early CAD    Physical Exam  stable appearing elderly man, NAD HEENT: Unremarkable Neck:  9 cm JVD, no thyromegally Back:  No CVA tenderness Lungs:  Clear with scattered rales HEART:  IRegular brady rhythm, no murmurs, no rubs, no clicks Abd:  soft, positive bowel sounds, no organomegally, no rebound, no guarding Ext:  2 plus pulses, no edema, no cyanosis, no clubbing Skin:  No rashes no nodules Neuro:  CN II through XII intact, motor grossly intact   Labs:   Lab Results  Component Value Date   WBC 7.8 02/23/2013   HGB 16.6 02/23/2013   HCT 48.2 02/23/2013   MCV 94.0 02/23/2013   PLT 89* 02/23/2013    Recent Labs Lab 02/23/13 0321  NA 136  K 3.6  CL 100  CO2 27  BUN  28*  CREATININE 1.31  CALCIUM 9.0  GLUCOSE 111*    Radiology/Studies: Dg Chest 2 View 02/22/2013  *RADIOLOGY REPORT*  Clinical Data: Bradycardia, hypertension, diabetes, weakness, coronary artery disease post MI  CHEST - 2 VIEW  Comparison: 04/27/2007  Findings: Upper normal heart size. Mediastinal contours and pulmonary vascularity normal. Coronary arterial stent noted. Bronchitic changes without infiltrate, pleural effusion or pneumothorax. Mild bibasilar atelectasis. No acute osseous findings. Scattered end plate spur formation thoracic spine. Atherosclerotic calcification aortic arch. Right carotid stent noted.  IMPRESSION: Bronchitic changes with bibasilar atelectasis.   Original Report Authenticated By: Ulyses Southward, M.D.     EKG: coarse atrial fibrillation with slow ventricular response  TELEMETRY:  Coarse atrial fibrillation with slow ventricular response  A/P 1. Symptomatic bradycardia 2. PAF 3. H/o atrial flutter s/p ablation 4. CAD Rec: he needs PPM, therapeutic anti-coagulation and re-initiation of amio followed by DCCV. Will attempt to place DDD PPM today.   Leonia Reeves.D.

## 2013-02-24 ENCOUNTER — Telehealth: Payer: Self-pay | Admitting: Physician Assistant

## 2013-02-24 ENCOUNTER — Inpatient Hospital Stay (HOSPITAL_COMMUNITY): Payer: Medicare Other

## 2013-02-24 DIAGNOSIS — Z950 Presence of cardiac pacemaker: Secondary | ICD-10-CM

## 2013-02-24 DIAGNOSIS — I4819 Other persistent atrial fibrillation: Secondary | ICD-10-CM

## 2013-02-24 DIAGNOSIS — R001 Bradycardia, unspecified: Secondary | ICD-10-CM

## 2013-02-24 LAB — GLUCOSE, CAPILLARY: Glucose-Capillary: 131 mg/dL — ABNORMAL HIGH (ref 70–99)

## 2013-02-24 LAB — PROTIME-INR: INR: 1.82 — ABNORMAL HIGH (ref 0.00–1.49)

## 2013-02-24 MED ORDER — CARVEDILOL 3.125 MG PO TABS: 3.1250 mg | ORAL_TABLET | Freq: Two times a day (BID) | ORAL | Status: DC

## 2013-02-24 MED ORDER — WARFARIN SODIUM 5 MG PO TABS
5.0000 mg | ORAL_TABLET | Freq: Once | ORAL | Status: DC
  Filled 2013-02-24: qty 1

## 2013-02-24 NOTE — Discharge Summary (Addendum)
Discharge Summary   Patient ID: Shawn Reyes,  MRN: [id number], DOB/AGE: [birth year] 77 y.o.  Admit date: 02/22/2013 Discharge date: 03/01/2013  Primary Physician: Elmo Putt, MD Primary Cardiologist: Reece Agar. Ladona Ridgel, MD  Discharge Diagnoses Principal Problem:   Symptomatic bradycardia Active Problems:   S/P cardiac pacemaker procedure   Persistent atrial fibrillation   DM   DYSLIPIDEMIA   HYPERTENSION   CAD   Atrial flutter   CKD (chronic kidney disease), stage III  Allergies No Known Allergies  Diagnostic Studies/Procedures  PA/LATERAL CHEST X-RAY - 02/22/13  IMPRESSION:  Bronchitic changes with bibasilar atelectasis.   PACEMAKER IMPLANTATION - 02/23/13  Successful placement of St. Jude dual-chamber pacemaker  PA/LATERAL CHEST X-RAY - 02/24/13  IMPRESSION:  1. Bilateral pleural effusions.  2. Right base atelectasis.  3. No pneumothorax after pacer placement.   History of Present Illness Shawn Reyes is a 79 y.o. male who was admitted to [facility] on 02/22/13 with the above problem list.   He has a history of paroxysmal atrial fibrillation on chronic Coumadin anticoagulation (followed by primary care), coronary artery disease status post prior LAD stenting and atrial flutter status post radiofrequency catheter ablation 01/2012. He is in his usual state of health until approximately Monday ago when he began endorsing increase her weakness and bradycardia-heart rate 43. His beta blocker was stopped and his heart rate improved to the mid 50s. Symptoms improved for approximately 3 weeks, however he developed general malaise and cold-like symptoms. Heart rate was noted to be in the 40s the day of admission, and he was advised to present to the ED.   There, EKG and telemetry revealed atrial flutter, 49 beats per minute. He denied ischemic or CHF type symptoms. His initial troponin was normal. INR 1.79. Chest x-ray as above revealed bronchitic changes with bibasilar  atelectasis. The plan was made to admit the patient and have the EP team evaluate him to consider pacemaker placement.  Hospital Course   3 subsequent sets of troponin I returned within normal limits. The EP team evaluated the patient and the following day and recommended permanent pacemaker placement. Pharmacy was consulted for Coumadin management while inpatient.  He was informed, consented and prepped for permanent pacemaker placement. Please see the full operative report for entire details of the procedure. This resulted in a successful implantation of St. Jude dual-chamber pacemaker for symptomatically bradycardia persistent atrial fibrillation. He tolerated this procedure well without complications.   He remained stable overnight without complaints. Renal function remained stable. Post implantation device interrogation and chest x-ray indicated normal functionality and no evidence of pneumothorax, respectively. There was evidence of new right base atelectasis. He was evaluated by Dr. Ladona Ridgel this morning and deemed stable for discharge. He will followup in the wound care clinic in 2 weeks and with Dr. Ladona Ridgel in approximately 3 months. He has been advised to have his Coumadin checked later next week by his PCP. Additionally, he will be discharged on an incentive spirometer to improve pulmonary aeration post device implantation. He will be discharged on the medication regimen listed below. He will resume prior carvedilol regimen. Amiodarone however will be held until his INR is known to be therapeutic. This information, including post device wound care and activity restrictions, has been clearly outlined in the discharge AVS.   Discharge Vitals:  Blood pressure 157/63, pulse 60, temperature 97.9 F (36.6 C), temperature source Oral, resp. rate 18, height 5\' 11"  (1.803 m), weight 85.5 kg (188 lb 7.9 oz), SpO2 96.00%.  Weight change:   Labs:  Recent Labs Lab 02/22/13 1650 02/23/13 0321  NA 136  136  K 4.2 3.6  CL 102 100  CO2 23 27  BUN 34* 28*  CREATININE 1.24 1.31  CALCIUM 9.5 9.0  GLUCOSE 128* 111*   Disposition:  Discharge Orders   Future Orders Complete By Expires     Diet - low sodium heart healthy  As directed     Increase activity slowly  As directed           Follow-up Information   Follow up with [facility]. (Will plan for a post-pacemaker wound check appointment in 2 weeks. Office will call with an appointment date and time. )    Contact information:   [address] [place] [address]       Follow up with Elmo Putt, MD. (Please make an appointment to have Coumadin checked between April 30-Mar 02, 2013. )    Contact information:   [address] [place] [address] [phone]       Follow up with [facility]. (Follow-up with Dr. Ladona Ridgel in 3 months. Office will call with an appointment date and time. )    Contact information:   [address] [place] [address]       Discharge Medications:    Medication List    TAKE these medications       acetaminophen 500 MG tablet  Commonly known as:  TYLENOL  Take 1,000 mg by mouth daily as needed for pain.     amLODipine 2.5 MG tablet  Commonly known as:  NORVASC  Take 2.5 mg by mouth every evening.     b complex vitamins capsule  Take 1 capsule by mouth daily.     carvedilol 3.125 MG tablet  Commonly known as:  COREG  Take 1 tablet (3.125 mg total) by mouth 2 (two) times daily with a meal.     dorzolamide-timolol 22.3-6.8 MG/ML ophthalmic solution  Commonly known as:  COSOPT  Place 1 drop into both eyes 2 (two) times daily.     hydrochlorothiazide 25 MG tablet  Commonly known as:  HYDRODIURIL  Take 25 mg by mouth daily.     isosorbide mononitrate 30 MG 24 hr tablet  Commonly known as:  IMDUR  Take 1 tablet (30 mg total) by mouth daily.     latanoprost 0.005 % ophthalmic solution  Commonly known as:  XALATAN    Place 1 drop into both eyes at bedtime.     Melatonin 3 MG Tabs  Take 3 mg by mouth at bedtime.     nitroGLYCERIN 0.4 MG SL tablet  Commonly known as:  NITROSTAT  Place 0.4 mg under the tongue every 5 (five) minutes as needed for chest pain.     simvastatin 40 MG tablet  Commonly known as:  ZOCOR  Take 40 mg by mouth at bedtime.     testosterone cypionate 200 MG/ML injection  Commonly known as:  DEPOTESTOTERONE CYPIONATE  Inject 200 mg into the muscle every 21 ( twenty-one) days.     warfarin 4 MG tablet  Commonly known as:  COUMADIN  Take 2-4 mg by mouth every evening. Alternates between 3 mg and 4 mg daily. For the 3 mg dose, he takes 1/2 tab of the 4 mg tablet along with a 1 mg tablet.     warfarin 1 MG tablet  Commonly known as:  COUMADIN  Take 1  mg by mouth every other day. Takes every other evening with a half of a 4 mg tablet to equal a total dose of 3 mg       Outstanding Labs/Studies: None  Duration of Discharge Encounter: Greater than 30 minutes including physician time.  Signed, R. Hurman Horn, PA-C 03/01/2013, 8:55 AM  ADDENDUM: Discharge summary amended to include renal status.   Jacqulyn Bath, PA-C 03/01/2013 9:01 AM

## 2013-02-24 NOTE — Progress Notes (Signed)
ANTICOAGULATION CONSULT NOTE - Initial Consult  Pharmacy Consult for warfarin Indication: Atrial flutter/PAF   No Known Allergies  Patient Measurements: Height: 5\' 11"  (180.3 cm) Weight: 188 lb 7.9 oz (85.5 kg) IBW/kg (Calculated) : 75.3  Vital Signs: Temp: 97.9 F (36.6 C) (04/26 0759) Temp src: Oral (04/26 0759) BP: 157/63 mmHg (04/26 0759) Pulse Rate: 60 (04/26 0759)  Labs:  Recent Labs  02/22/13 1650 02/22/13 2157 02/23/13 0321 02/23/13 0550 02/23/13 0811 02/24/13 0712  HGB 17.1*  --  16.6  --   --   --   HCT 48.6  --  48.2  --   --   --   PLT 95*  --  89*  --   --   --   LABPROT 20.2*  --  20.3* 20.1*  --  20.4*  INR 1.79*  --  1.81* 1.78*  --  1.82*  HEPARINUNFRC  --   --   --  0.47  --   --   CREATININE 1.24  --  1.31  --   --   --   TROPONINI  --  <0.30 <0.30  --  <0.30  --     Estimated Creatinine Clearance: 40.7 ml/min (by C-G formula based on Cr of 1.31).  Medications:  PTA Warfarin dose was alternating 3 mg and 4 mg daily  Assessment: 79 y.o. M who presented to the [facility] on 02/22/13 with symptomatic bradycardia. The patient has a known history of Afib and was on warfarin PTA -- however had a SUBtherapeutic INR on admission. Pharmacy was consulted to resume the patient's home warfarin and bridge with heparin for hx Afib. PTA the patient's home dose of warfarin was alternating between warfarin 3 mg and 4 mg daily. Heparin was stopped prior to pacemaker insertion last evening, to continue warfarin. H/H/plt stable, no bleeding noted  Goal of Therapy:  INR 2-3 Monitor platelets by anticoagulation protocol: Yes   Plan:  1. Warfarin 5mg  po x1 tonight 2. Daily PT/INR while in the hospital 3. If to be discharged this weekend, can go home on PTA warfarin dose of alternating 4mg  and 3mg  with plan for INR check later in the week

## 2013-02-24 NOTE — Progress Notes (Signed)
Patient ID: Shawn Reyes, male   DOB: 10/04/1921, 79 y.o.   MRN: [id number] Subjective:  S/p PPM doing well.   Objective:  Vital Signs in the last 24 hours: Temp:  [97.3 F (36.3 C)-98 F (36.7 C)] 97.9 F (36.6 C) (04/26 0759) Pulse Rate:  [59-60] 60 (04/26 0759) Resp:  [12-22] 18 (04/26 0759) BP: (117-185)/(46-82) 157/63 mmHg (04/26 0759) SpO2:  [84 %-100 %] 96 % (04/26 0759) Weight:  [188 lb 7.9 oz (85.5 kg)] 188 lb 7.9 oz (85.5 kg) (04/26 0500)  Intake/Output from previous day: 04/25 0701 - 04/26 0700 In: 621.3 [P.O.:360; I.V.:161.3; IV Piggyback:100] Out: 1050 [Urine:1050] Intake/Output from this shift:    Physical Exam: elderly appearing NAD HEENT: Unremarkable Neck:  7 cm JVD, no thyromegally Back:  No CVA tenderness Lungs:  Clear except for rare scaterred rales. PPM incision without hematoma. HEART:  Regular rate rhythm, no murmurs, no rubs, no clicks Abd:  soft, positive bowel sounds, no organomegally, no rebound, no guarding Ext:  2 plus pulses, no edema, no cyanosis, no clubbing Skin:  No rashes no nodules Neuro:  CN II through XII intact, motor grossly intact  Lab Results:  Recent Labs  02/22/13 1650 02/23/13 0321  WBC 8.4 7.8  HGB 17.1* 16.6  PLT 95* 89*    Recent Labs  02/22/13 1650 02/23/13 0321  NA 136 136  K 4.2 3.6  CL 102 100  CO2 23 27  GLUCOSE 128* 111*  BUN 34* 28*  CREATININE 1.24 1.31    Recent Labs  02/23/13 0321 02/23/13 0811  TROPONINI <0.30 <0.30   Hepatic Function Panel No results found for this basename: PROT, ALBUMIN, AST, ALT, ALKPHOS, BILITOT, BILIDIR, IBILI,  in the last 72 hours No results found for this basename: CHOL,  in the last 72 hours No results found for this basename: PROTIME,  in the last 72 hours  Imaging: Dg Chest 2 View  02/22/2013  *RADIOLOGY REPORT*  Clinical Data: Bradycardia, hypertension, diabetes, weakness, coronary artery disease post MI  CHEST - 2 VIEW  Comparison: 04/27/2007  Findings: Upper  normal heart size. Mediastinal contours and pulmonary vascularity normal. Coronary arterial stent noted. Bronchitic changes without infiltrate, pleural effusion or pneumothorax. Mild bibasilar atelectasis. No acute osseous findings. Scattered end plate spur formation thoracic spine. Atherosclerotic calcification aortic arch. Right carotid stent noted.  IMPRESSION: Bronchitic changes with bibasilar atelectasis.   Original Report Authenticated By: Ulyses Southward, M.D.     Cardiac Studies: Tele - atrial fib with ventricular pacing Assessment/Plan:  1. Atrial fibrillation 2. Symptomatic bradycardia with no reversible cause 3. S/p PPM Plan - will review CXR. His device is working normally. Will discharge home if cxr ok with usual followup. I would like to see him in [place] or [place] in 3 months and device clinic in 2 weeks. He will need his coumadin level checked later next week.  LOS: 2 days    Dahlia Nifong,M.D. 02/24/2013, 8:29 AM

## 2013-02-24 NOTE — Telephone Encounter (Signed)
The patient's girlfriend called the answering svc requesting assistance with setting up Merlin home monitor paired with his recently implanted St. Jude PPM. He was provide with a users' manuel, quick start guide and DVD, however they are having difficulty configuring the monitor stating their phone line is non-functional. I provided them with the number for technical support, and personally called the weekend on call representative explaining the situation. Per the rep, they will contact them tonight and have a technician assist them on Monday. Reassured them both that his pacemaker is functioning normally. They understood and agreed.   Jacqulyn Bath, PA-C 02/24/2013 7:20 PM

## 2013-02-26 ENCOUNTER — Telehealth: Payer: Self-pay | Admitting: Internal Medicine

## 2013-02-26 NOTE — Telephone Encounter (Signed)
New Problem:    Patient called in wanting to speak with you regarding the device he had placed.  Please call back.

## 2013-02-26 NOTE — Telephone Encounter (Signed)
Needs to have his appointment set up for [facility] and 3 month post hosp.

## 2013-02-28 ENCOUNTER — Telehealth: Payer: Self-pay | Admitting: Internal Medicine

## 2013-02-28 NOTE — Telephone Encounter (Addendum)
02-26-13 LMM @ 910AM TO CALL TO SET UP 10 DAY PACER WOUND CK WITH DEVICE FROM 02-24-13/MT 02-27-13 LMM @ 958AM HOME AND 1000AM CELL/MT 02-28-13 LMM @ 304PM/MT 03-01-13 lmm @ 1227p home and 1228p cell/will send letter/mt

## 2013-03-06 ENCOUNTER — Ambulatory Visit: Payer: Medicare Other | Admitting: Internal Medicine

## 2013-03-07 ENCOUNTER — Other Ambulatory Visit: Payer: Self-pay | Admitting: Internal Medicine

## 2013-03-07 ENCOUNTER — Ambulatory Visit (INDEPENDENT_AMBULATORY_CARE_PROVIDER_SITE_OTHER): Payer: Medicare Other | Admitting: *Deleted

## 2013-03-07 ENCOUNTER — Encounter: Payer: Self-pay | Admitting: Internal Medicine

## 2013-03-07 DIAGNOSIS — I4892 Unspecified atrial flutter: Secondary | ICD-10-CM

## 2013-03-07 NOTE — Progress Notes (Signed)
Wound check ppm in clinic. Normal device function. ROV in 3 months w/JA.

## 2013-03-08 LAB — PACEMAKER DEVICE OBSERVATION
AL IMPEDENCE PM: 662.5 Ohm
ATRIAL PACING PM: 0
BAMS-0003: 60 {beats}/min
BRDY-0003RV: 110 {beats}/min
BRDY-0004RV: 110 {beats}/min
RV LEAD THRESHOLD: 0.5 V
VENTRICULAR PACING PM: 98

## 2013-03-08 NOTE — Telephone Encounter (Signed)
No phone in clinic. Spoke w/scheduler about this.

## 2013-03-08 NOTE — Telephone Encounter (Signed)
New Prob     Pt is missing his cell phone and was calling in to see if it was left at the office in the device clinic.

## 2013-05-03 ENCOUNTER — Other Ambulatory Visit: Payer: Self-pay | Admitting: *Deleted

## 2013-05-03 MED ORDER — ISOSORBIDE MONONITRATE ER 30 MG PO TB24: 30.0000 mg | ORAL_TABLET | Freq: Every day | ORAL | Status: DC

## 2013-05-03 NOTE — Telephone Encounter (Signed)
Refill sent.

## 2013-05-31 ENCOUNTER — Ambulatory Visit (INDEPENDENT_AMBULATORY_CARE_PROVIDER_SITE_OTHER): Payer: Medicare Other | Admitting: Internal Medicine

## 2013-05-31 ENCOUNTER — Encounter: Payer: Self-pay | Admitting: Internal Medicine

## 2013-05-31 VITALS — BP 122/68 | HR 63 | Ht 71.0 in | Wt 202.0 lb

## 2013-05-31 DIAGNOSIS — R001 Bradycardia, unspecified: Secondary | ICD-10-CM

## 2013-05-31 DIAGNOSIS — Z950 Presence of cardiac pacemaker: Secondary | ICD-10-CM

## 2013-05-31 DIAGNOSIS — I498 Other specified cardiac arrhythmias: Secondary | ICD-10-CM

## 2013-05-31 DIAGNOSIS — I4891 Unspecified atrial fibrillation: Secondary | ICD-10-CM

## 2013-05-31 LAB — PACEMAKER DEVICE OBSERVATION
ATRIAL PACING PM: 0
BAMS-0001: 150 {beats}/min
BAMS-0003: 60 {beats}/min
BRDY-0002RV: 60 {beats}/min
BRDY-0003RV: 110 {beats}/min
RV LEAD AMPLITUDE: 12 mv
RV LEAD THRESHOLD: 0.5 V

## 2013-05-31 MED ORDER — SIMVASTATIN 40 MG PO TABS: 20.0000 mg | ORAL_TABLET | Freq: Every day | ORAL | Status: DC

## 2013-05-31 MED ORDER — AMIODARONE HCL 200 MG PO TABS: 200.0000 mg | ORAL_TABLET | Freq: Two times a day (BID) | ORAL | Status: DC

## 2013-05-31 NOTE — Progress Notes (Signed)
PCP: Elmo Putt, MD Primary Cardiologist:  Dr Ladona Ridgel  Shawn Reyes is a 79 y.o. male who presents today for routine electrophysiology followup.  Since having his pacemaker implanted, the patient reports doing very well.  He continues to have fatigue.  He does not sleep well.  He only intermittently uses CPAP.  Today, he denies symptoms of palpitations, chest pain, shortness of breath,  lower extremity edema, dizziness, presyncope, or syncope.  The patient is otherwise without complaint today.   Past Medical History  Diagnosis Date  . CAD (coronary artery disease)     a. s/p MI;  b. s/p LAD PCI;  c. Cath 09/2007: long restenotic area, 95% stenosis in the previously placed LAD stent. The distal LAD had 30-40% stenosis. Intermediate had 30-40% stenosis. The circumflex had 40% stenosis in  obtuse marginal. The right coronary artery had 30% followed by 70-8-% stenosis followed by total occlusion. There were left to right collaterals. He had PCI of LAD w/ Taxus DES.  Marland Kitchen PAF (paroxysmal atrial fibrillation)     On chronic Coumadin Therapy  . Transient ischemic attack     Questionable  . Sleep apnea   . Prostate cancer     Post radiation  . Diabetes mellitus   . Hypertension   . Dyslipidemia   . Renal insufficiency     chronic  . GERD (gastroesophageal reflux disease)   . Arthritis   . Second degree AV block, Mobitz type I     ecg's 4/20113  . Atrial flutter     a. s/p rfca 01/2012   Past Surgical History  Procedure Laterality Date  . Esophagogastric fundoplication    . Carotid stent  Feb. 2011    Right   . A flutter ablation    . Hernia repair    . Cataracts    . Pacemaker insertion  02/23/13    SJM Accent DR RF implanted by DR Kaoru Rezendes for symptomatic bradycardia    Current Outpatient Prescriptions  Medication Sig Dispense Refill  . acetaminophen (TYLENOL) 500 MG tablet Take 1,000 mg by mouth daily as needed for pain.      Marland Kitchen amLODipine (NORVASC) 2.5 MG tablet Take 2.5 mg by mouth  every evening.       Marland Kitchen b complex vitamins capsule Take 1 capsule by mouth daily.      . dorzolamide-timolol (COSOPT) 22.3-6.8 MG/ML ophthalmic solution Place 1 drop into both eyes 2 (two) times daily.      . hydrochlorothiazide 25 MG tablet Take 25 mg by mouth daily.       . isosorbide mononitrate (IMDUR) 30 MG 24 hr tablet Take 1 tablet (30 mg total) by mouth daily.  90 tablet  3  . Melatonin 3 MG TABS Take 3 mg by mouth at bedtime.      . nitroGLYCERIN (NITROSTAT) 0.4 MG SL tablet Place 0.4 mg under the tongue every 5 (five) minutes as needed for chest pain.       . simvastatin (ZOCOR) 40 MG tablet Take 40 mg by mouth at bedtime.       Marland Kitchen testosterone cypionate (DEPOTESTOTERONE CYPIONATE) 200 MG/ML injection Inject 200 mg into the muscle every 21 ( twenty-one) days.       Marland Kitchen warfarin (COUMADIN) 1 MG tablet Take 1 mg by mouth every other day. Takes every other evening with a half of a 4 mg tablet to equal a total dose of 3 mg      . warfarin (COUMADIN) 4 MG  tablet Take 2-4 mg by mouth every evening. Alternates between 3 mg and 4 mg daily. For the 3 mg dose, he takes 1/2 tab of the 4 mg tablet along with a 1 mg tablet.       No current facility-administered medications for this visit.    Physical Exam: Filed Vitals:   05/31/13 1547  BP: 122/68  Pulse: 63  Height: 5\' 11"  (1.803 m)  Weight: 202 lb (91.627 kg)  SpO2: 96%    GEN- The patient is elderly appearing, alert and oriented x 3 today.   Head- normocephalic, atraumatic Eyes-  Sclera clear, conjunctiva pink Ears- poor hearing Oropharynx- clear Lungs- Clear to ausculation bilaterally, normal work of breathing Chest- pacemaker pocket is well healed Heart- irregular rate and rhythm  GI- soft, NT, ND, + BS Extremities- no clubbing, cyanosis, or edema  Pacemaker interrogation- reviewed in detail today,  See PACEART report  Assessment and Plan:  1. Bradycardia Normal pacemaker function See Pace Art report No changes today  2.  Persistent afib As per Dr Bruna Potter recommendation (See his consult note 4/14), will start amiodarone at this time. Risks, benefits, and alternatives to amiodarone were discussed at length with the patient who wishes to proceed. Will need weekly INR checks. Once INRs have been therapeutic x 4 weeks, the patient will proceed with cardioversion. Amiodarone 200mg  BID  Follow-up with Dr Ladona Ridgel in 8 weeks As he is well known to Dr Ladona Ridgel, I will return his care to Dr Ladona Ridgel at this time.

## 2013-05-31 NOTE — Patient Instructions (Addendum)
Your physician recommends that you schedule a follow-up appointment in: 3 weeks in the nurse room for an EKG(if still out of rhythm will set up DCCV), and follow up with Dr Ladona Ridgel in 8 weeks  Weekly INR's in Coumadin Clinic  Your physician has recommended you make the following change in your medication:  1) Stop Carvedilol 2) Start Amiodarone 200mg  twice daily 3) Decrease Simvastatin to 20mg  daily ---take 1/2 of a 40mg  tablet daily

## 2013-06-01 LAB — PROTIME-INR

## 2013-06-07 LAB — PROTIME-INR

## 2013-06-11 ENCOUNTER — Encounter: Payer: Self-pay | Admitting: Internal Medicine

## 2013-06-11 LAB — PROTIME-INR

## 2013-06-14 ENCOUNTER — Encounter: Payer: Self-pay | Admitting: Internal Medicine

## 2013-06-21 ENCOUNTER — Ambulatory Visit (INDEPENDENT_AMBULATORY_CARE_PROVIDER_SITE_OTHER): Payer: Medicare Other | Admitting: *Deleted

## 2013-06-21 VITALS — HR 60 | Wt 201.0 lb

## 2013-06-21 DIAGNOSIS — I4892 Unspecified atrial flutter: Secondary | ICD-10-CM

## 2013-06-21 LAB — CBC WITH DIFFERENTIAL/PLATELET
Basophils Relative: 0.5 % (ref 0.0–3.0)
Eosinophils Absolute: 0.3 10*3/uL (ref 0.0–0.7)
Eosinophils Relative: 3.1 % (ref 0.0–5.0)
Lymphocytes Relative: 9.5 % — ABNORMAL LOW (ref 12.0–46.0)
MCHC: 33.6 g/dL (ref 30.0–36.0)
Neutrophils Relative %: 75.3 % (ref 43.0–77.0)
RBC: 5.41 Mil/uL (ref 4.22–5.81)
WBC: 9.3 10*3/uL (ref 4.5–10.5)

## 2013-06-21 LAB — MAGNESIUM: Magnesium: 2.1 mg/dL (ref 1.5–2.5)

## 2013-06-21 LAB — BASIC METABOLIC PANEL
Calcium: 9.1 mg/dL (ref 8.4–10.5)
Creatinine, Ser: 1.3 mg/dL (ref 0.4–1.5)
GFR: 55.65 mL/min — ABNORMAL LOW (ref >60.00)

## 2013-06-21 LAB — PROTIME-INR: INR: 2.4 ratio — ABNORMAL HIGH (ref 0.8–1.0)

## 2013-06-21 NOTE — Patient Instructions (Addendum)
Your physician has recommended that you have a Cardioversion (DCCV). Electrical Cardioversion uses a jolt of electricity to your heart either through paddles or wired patches attached to your chest. This is a controlled, usually prescheduled, procedure. Defibrillation is done under light anesthesia in the hospital, and you usually go home the day of the procedure. This is done to get your heart back into a normal rhythm. You are not awake for the procedure. Please see the instruction sheet given to you today.  I let him know I would discuss with Dr Ladona Ridgel today and call him this afternoon

## 2013-06-22 ENCOUNTER — Encounter: Payer: Self-pay | Admitting: *Deleted

## 2013-06-25 ENCOUNTER — Other Ambulatory Visit: Payer: Self-pay | Admitting: *Deleted

## 2013-06-27 ENCOUNTER — Ambulatory Visit (HOSPITAL_COMMUNITY)
Admission: RE | Admit: 2013-06-27 | Discharge: 2013-06-27 | Disposition: A | Discharge status: Home / Self Care | Payer: Medicare Other | Source: Ambulatory Visit | Attending: Cardiology | Admitting: Cardiology

## 2013-06-27 ENCOUNTER — Encounter (HOSPITAL_COMMUNITY)
Admission: RE | Disposition: A | Discharge status: Home / Self Care | Payer: Self-pay | Source: Ambulatory Visit | Attending: Cardiology

## 2013-06-27 ENCOUNTER — Encounter: Payer: Self-pay | Admitting: Internal Medicine

## 2013-06-27 ENCOUNTER — Ambulatory Visit (HOSPITAL_COMMUNITY): Payer: Medicare Other | Admitting: Certified Registered"

## 2013-06-27 ENCOUNTER — Encounter (HOSPITAL_COMMUNITY): Payer: Self-pay | Admitting: Certified Registered"

## 2013-06-27 DIAGNOSIS — I4891 Unspecified atrial fibrillation: Secondary | ICD-10-CM | POA: Insufficient documentation

## 2013-06-27 DIAGNOSIS — I10 Essential (primary) hypertension: Secondary | ICD-10-CM | POA: Insufficient documentation

## 2013-06-27 DIAGNOSIS — Z7901 Long term (current) use of anticoagulants: Secondary | ICD-10-CM | POA: Insufficient documentation

## 2013-06-27 DIAGNOSIS — E119 Type 2 diabetes mellitus without complications: Secondary | ICD-10-CM | POA: Insufficient documentation

## 2013-06-27 DIAGNOSIS — Z950 Presence of cardiac pacemaker: Secondary | ICD-10-CM | POA: Insufficient documentation

## 2013-06-27 DIAGNOSIS — Z9861 Coronary angioplasty status: Secondary | ICD-10-CM | POA: Insufficient documentation

## 2013-06-27 DIAGNOSIS — I2510 Atherosclerotic heart disease of native coronary artery without angina pectoris: Secondary | ICD-10-CM | POA: Insufficient documentation

## 2013-06-27 DIAGNOSIS — I252 Old myocardial infarction: Secondary | ICD-10-CM | POA: Insufficient documentation

## 2013-06-27 HISTORY — PX: CARDIOVERSION: SHX1299

## 2013-06-27 LAB — BASIC METABOLIC PANEL
BUN: 33 mg/dL — ABNORMAL HIGH (ref 6–23)
Chloride: 98 mEq/L (ref 96–112)
Creatinine, Ser: 1.44 mg/dL — ABNORMAL HIGH (ref 0.50–1.35)
Glucose, Bld: 127 mg/dL — ABNORMAL HIGH (ref 70–99)
Potassium: 3.9 mEq/L (ref 3.5–5.1)

## 2013-06-27 SURGERY — CARDIOVERSION
Anesthesia: Monitor Anesthesia Care

## 2013-06-27 MED ORDER — SODIUM CHLORIDE 0.9 % IV SOLN
INTRAVENOUS | Status: DC | PRN
  Administered 2013-06-27: 11:00:00 via INTRAVENOUS

## 2013-06-27 MED ORDER — LIDOCAINE HCL (CARDIAC) 20 MG/ML IV SOLN
INTRAVENOUS | Status: DC | PRN
  Administered 2013-06-27: 60 mg via INTRAVENOUS

## 2013-06-27 MED ORDER — PROPOFOL 10 MG/ML IV BOLUS
INTRAVENOUS | Status: DC | PRN
  Administered 2013-06-27: 70 mg via INTRAVENOUS

## 2013-06-27 NOTE — Anesthesia Preprocedure Evaluation (Addendum)
Anesthesia Evaluation  Patient identified by MRN, date of birth, ID band Patient awake    Reviewed: Allergy & Precautions, H&P , NPO status , Patient's Chart, lab work & pertinent test results, reviewed documented beta blocker date and time   Airway Mallampati: II TM Distance: >3 FB     Dental  (+) Teeth Intact   Pulmonary sleep apnea and Continuous Positive Airway Pressure Ventilation ,          Cardiovascular hypertension, + CAD + dysrhythmias Atrial Fibrillation Rhythm:Irregular     Neuro/Psych    GI/Hepatic GERD-  ,  Endo/Other  diabetes  Renal/GU      Musculoskeletal   Abdominal   Peds  Hematology   Anesthesia Other Findings   Reproductive/Obstetrics                           Anesthesia Physical Anesthesia Plan  ASA: III  Anesthesia Plan: General   Post-op Pain Management:    Induction: Intravenous  Airway Management Planned: Mask  Additional Equipment:   Intra-op Plan:   Post-operative Plan:   Informed Consent: I have reviewed the patients History and Physical, chart, labs and discussed the procedure including the risks, benefits and alternatives for the proposed anesthesia with the patient or authorized representative who has indicated his/her understanding and acceptance.     Plan Discussed with: Anesthesiologist and Surgeon  Anesthesia Plan Comments:         Anesthesia Quick Evaluation

## 2013-06-27 NOTE — Transfer of Care (Signed)
Immediate Anesthesia Transfer of Care Note  Patient: Shawn Reyes  Procedure(s) Performed: Procedure(s): CARDIOVERSION (N/A)  Patient Location: PACU  Anesthesia Type:General  Level of Consciousness: sedated and responds to stimulation  Airway & Oxygen Therapy: Patient Spontanous Breathing and Patient connected to nasal cannula oxygen  Post-op Assessment: Report given to PACU RN, Post -op Vital signs reviewed and stable and Patient moving all extremities  Post vital signs: Reviewed and stable  Complications: No apparent anesthesia complications

## 2013-06-27 NOTE — H&P (Signed)
Updated history and physical:  The patient has atrial fibrillation. Since his last office visit there has been very careful attention to his anticoagulation. There has been complete preparation for his cardioversion. He's here today for cardioversion.   Shawn Reyes  05/31/2013 3:15 PM   Office Visit  MRN:  [id number]   Description: 79 year old male  Provider: Hillis Range, MD  Department: [facility]        Referring Provider    Elmo Putt III, MD      Diagnoses    Atrial fibrillation    -  Primary    427.31    S/P cardiac pacemaker procedure        V45.01    Symptomatic bradycardia        427.89      Reason for Visit    Appointment         Progress Notes    Hillis Range, MD at 05/31/2013  4:38 PM    Status: Signed                   PCP: Elmo Putt, MD Primary Cardiologist:  Dr Ladona Ridgel   Shawn Reyes is a 79 y.o. male who presents today for routine electrophysiology followup.  Since having his pacemaker implanted, the patient reports doing very well.  He continues to have fatigue.  He does not sleep well.  He only intermittently uses CPAP.  Today, he denies symptoms of palpitations, chest pain, shortness of breath,  lower extremity edema, dizziness, presyncope, or syncope.  The patient is otherwise without complaint today.     Past Medical History   Diagnosis  Date   .  CAD (coronary artery disease)         a. s/p MI;  b. s/p LAD PCI;  c. Cath 09/2007: long restenotic area, 95% stenosis in the previously placed LAD stent. The distal LAD had 30-40% stenosis. Intermediate had 30-40% stenosis. The circumflex had 40% stenosis in  obtuse marginal. The right coronary artery had 30% followed by 70-8-% stenosis followed by total occlusion. There were left to right collaterals. He had PCI of LAD w/ Taxus DES.   Marland Kitchen  PAF (paroxysmal atrial fibrillation)         On chronic Coumadin Therapy   .  Transient ischemic attack         Questionable   .  Sleep apnea       .  Prostate cancer         Post radiation   .  Diabetes mellitus     .  Hypertension     .  Dyslipidemia     .  Renal insufficiency         chronic   .  GERD (gastroesophageal reflux disease)     .  Arthritis     .  Second degree AV block, Mobitz type I         ecg's 4/20113   .  Atrial flutter         a. s/p rfca 01/2012       Past Surgical History   Procedure  Laterality  Date   .  Esophagogastric fundoplication       .  Carotid stent    Feb. 2011       Right    .  A flutter ablation       .  Hernia repair       .  Cataracts       .  Pacemaker insertion    02/23/13       SJM Accent DR RF implanted by DR Allred for symptomatic bradycardia         Current Outpatient Prescriptions   Medication  Sig  Dispense  Refill   .  acetaminophen (TYLENOL) 500 MG tablet  Take 1,000 mg by mouth daily as needed for pain.         Marland Kitchen  amLODipine (NORVASC) 2.5 MG tablet  Take 2.5 mg by mouth every evening.          Marland Kitchen  b complex vitamins capsule  Take 1 capsule by mouth daily.         .  dorzolamide-timolol (COSOPT) 22.3-6.8 MG/ML ophthalmic solution  Place 1 drop into both eyes 2 (two) times daily.         .  hydrochlorothiazide 25 MG tablet  Take 25 mg by mouth daily.          .  isosorbide mononitrate (IMDUR) 30 MG 24 hr tablet  Take 1 tablet (30 mg total) by mouth daily.   90 tablet   3   .  Melatonin 3 MG TABS  Take 3 mg by mouth at bedtime.         .  nitroGLYCERIN (NITROSTAT) 0.4 MG SL tablet  Place 0.4 mg under the tongue every 5 (five) minutes as needed for chest pain.          .  simvastatin (ZOCOR) 40 MG tablet  Take 40 mg by mouth at bedtime.          Marland Kitchen  testosterone cypionate (DEPOTESTOTERONE CYPIONATE) 200 MG/ML injection  Inject 200 mg into the muscle every 21 ( twenty-one) days.          Marland Kitchen  warfarin (COUMADIN) 1 MG tablet  Take 1 mg by mouth every other day. Takes every other evening with a half of a 4 mg tablet to equal a total dose of 3 mg         .  warfarin (COUMADIN) 4 MG  tablet  Take 2-4 mg by mouth every evening. Alternates between 3 mg and 4 mg daily. For the 3 mg dose, he takes 1/2 tab of the 4 mg tablet along with a 1 mg tablet.             No current facility-administered medications for this visit.        Physical Exam: Filed Vitals:     05/31/13 1547   BP:  122/68   Pulse:  63   Height:  5\' 11"  (1.803 m)   Weight:  202 lb (91.627 kg)   SpO2:  96%        GEN- The patient is elderly appearing, alert and oriented x 3 today.    Head- normocephalic, atraumatic Eyes-  Sclera clear, conjunctiva pink Ears- poor hearing Oropharynx- clear Lungs- Clear to ausculation bilaterally, normal work of breathing Chest- pacemaker pocket is well healed Heart- irregular rate and rhythm   GI- soft, NT, ND, + BS Extremities- no clubbing, cyanosis, or edema   Pacemaker interrogation- reviewed in detail today,  See PACEART report   Assessment and Plan:   1. Bradycardia Normal pacemaker function See Pace Art report No changes today   2. Persistent afib As per Dr Bruna Potter recommendation (See his consult note 4/14), will start amiodarone at this time. Risks, benefits, and alternatives to amiodarone were discussed at length with  the patient who wishes to proceed. Will need weekly INR checks. Once INRs have been therapeutic x 4 weeks, the patient will proceed with cardioversion. Amiodarone 200mg  BID  Follow-up with Dr Ladona Ridgel in 8 weeks As he is well known to Dr Ladona Ridgel, I will return his care to Dr Ladona Ridgel at this time.             Encounter-Level Documents:    Electronic signature on 05/31/2013 3:15 PM          Vitals - Last Recorded    BP Pulse Ht Wt BMI Sp02    122/68 63 5\' 11"  (1.803 m) 202 lb (91.627 kg) 28.19 kg/m2 96%       Orders Placed This Encounter    Pacemaker Device Observation [VAS2001 Custom]       Results are available for this encounter       Ordered Medications      Disp Refills Start End    amiodarone  (PACERONE) 200 MG tablet 180 tablet 3 05/31/2013      Take 1 tablet (200 mg total) by mouth 2 (two) times daily. - Oral    simvastatin (ZOCOR) 40 MG tablet 30 tablet   05/31/2013      Take 0.5 tablets (20 mg total) by mouth at bedtime. - Oral       Discontinued Medications      Reason for Discontinue    latanoprost (XALATAN) 0.005 % ophthalmic solution Error    carvedilol (COREG) 3.125 MG tablet      simvastatin (ZOCOR) 40 MG tablet Reorder       Level of Service    PR OFFICE OUTPATIENT VISIT 40 MINUTES [16109]         All Charges for This Encounter    Code Description Service Date Service Provider Modifiers Qty    [phone] PR PROGRAM EVAL IMPLANTABLE IN PERSN DUAL LD PACER 05/31/2013 Hillis Range, MD   1    [phone] PR OFFICE OUTPATIENT VISIT 40 MINUTES 05/31/2013 Hillis Range, MD   1      Patient Instructions    Your physician recommends that you schedule a follow-up appointment in: 3 weeks in the nurse room for an EKG(if still out of rhythm will set up DCCV), and follow up with Dr Ladona Ridgel in 8 weeks   Weekly INR's in Coumadin Clinic   Your physician has recommended you make the following change in your medication:   1) Stop Carvedilol 2) Start Amiodarone 200mg  twice daily 3) Decrease Simvastatin to 20mg  daily ---take 1/2 of a 40mg  tablet daily    Patient is stable today for cardioversion.

## 2013-06-27 NOTE — Anesthesia Postprocedure Evaluation (Signed)
  Anesthesia Post-op Note  Patient: Alphonsus Doyel  Procedure(s) Performed: Procedure(s): CARDIOVERSION (N/A)  Patient Location: Endoscopy Unit  Anesthesia Type:General  Level of Consciousness: awake, alert , oriented and patient cooperative  Airway and Oxygen Therapy: Patient Spontanous Breathing and Patient connected to nasal cannula oxygen  Post-op Pain: none  Post-op Assessment: Post-op Vital signs reviewed, Patient's Cardiovascular Status Stable, Respiratory Function Stable, Patent Airway, No signs of Nausea or vomiting and Pain level controlled  Post-op Vital Signs: Reviewed and stable  Complications: No apparent anesthesia complications

## 2013-06-27 NOTE — CV Procedure (Signed)
   The patient was stable and ready for cardioversion. I had a careful discussion with him. He is hopeful that he will have more energy when this is completed.  Anesthesia was present. Consent was signed. Complete time out was done.  Patient received 70 mg of IV propofol. This was given by anesthesia.  Anterior posterior pads were in place. A biphasic defibrillator was used. The patient received one shock with 120 J of energy. He converted immediately to sinus rhythm. His pacemaker was interrogated continuously throughout the procedure. This showed that he had atrial fib underlying his rhythm before we started. After the single shock he had sinus rhythm. His pacemaker was left in a rate of 70.  Successful cardioversion with one shock. The patient will be allowed to go home when he is stable with his friend.  Jerral Bonito, MD

## 2013-06-28 ENCOUNTER — Encounter (HOSPITAL_COMMUNITY): Payer: Self-pay | Admitting: Cardiology

## 2013-07-17 ENCOUNTER — Encounter: Payer: Self-pay | Admitting: Internal Medicine

## 2013-07-31 ENCOUNTER — Encounter: Payer: Self-pay | Admitting: Internal Medicine

## 2013-07-31 ENCOUNTER — Ambulatory Visit (INDEPENDENT_AMBULATORY_CARE_PROVIDER_SITE_OTHER): Payer: Medicare Other | Admitting: Internal Medicine

## 2013-07-31 VITALS — BP 135/71 | HR 72 | Ht 71.0 in | Wt 196.0 lb

## 2013-07-31 DIAGNOSIS — Z950 Presence of cardiac pacemaker: Secondary | ICD-10-CM

## 2013-07-31 DIAGNOSIS — R001 Bradycardia, unspecified: Secondary | ICD-10-CM

## 2013-07-31 DIAGNOSIS — I4891 Unspecified atrial fibrillation: Secondary | ICD-10-CM

## 2013-07-31 DIAGNOSIS — I498 Other specified cardiac arrhythmias: Secondary | ICD-10-CM

## 2013-07-31 LAB — PACEMAKER DEVICE OBSERVATION
ATRIAL PACING PM: 99
BAMS-0001: 150 {beats}/min
BAMS-0003: 70 {beats}/min
BATTERY VOLTAGE: 2.993 V
DEVICE MODEL PM: 7460728
RV LEAD THRESHOLD: 0.75 V
VENTRICULAR PACING PM: 99

## 2013-07-31 MED ORDER — AMIODARONE HCL 200 MG PO TABS: 200.0000 mg | ORAL_TABLET | Freq: Every day | ORAL | Status: DC

## 2013-07-31 NOTE — Assessment & Plan Note (Signed)
His [employer]. Jude dual-chamber pacemaker is working normally. We'll plan to recheck in several months. 

## 2013-07-31 NOTE — Patient Instructions (Addendum)
Your physician wants you to follow-up in: 6 months with Dr Court Joy will receive a reminder letter in the mail two months in advance. If you don't receive a letter, please call our office to schedule the follow-up appointment.   Your physician has recommended you make the following change in your medication:  1) Decrease Amiodarone 200mg 

## 2013-07-31 NOTE — Assessment & Plan Note (Signed)
Pacemaker interrogation today demonstrates that he is maintaining sinus rhythm since his cardioversion with no recurrent atrial fibrillation. He will continue amiodarone therapy.

## 2013-07-31 NOTE — Progress Notes (Signed)
HPI [name] returns today for followup. 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 Because of severe bradycardia, he underwent pacemaker insertion several months ago. Otherwise he is been stable. He denies chest pain, shortness of breath, or syncope. No peripheral edema. No Known Allergies   Current Outpatient Prescriptions  Medication Sig Dispense Refill  . acetaminophen (TYLENOL) 500 MG tablet Take 1,000 mg by mouth daily as needed for pain.      Marland Kitchen amiodarone (PACERONE) 200 MG tablet Take 1 tablet (200 mg total) by mouth daily.  90 tablet  3  . amLODipine (NORVASC) 2.5 MG tablet Take 2.5 mg by mouth every evening.       Marland Kitchen b complex vitamins capsule Take 1 capsule by mouth daily.      . dorzolamide-timolol (COSOPT) 22.3-6.8 MG/ML ophthalmic solution Place 1 drop into both eyes 2 (two) times daily.      . hydrochlorothiazide 25 MG tablet Take 25 mg by mouth daily.       . isosorbide mononitrate (IMDUR) 30 MG 24 hr tablet Take 1 tablet (30 mg total) by mouth daily.  90 tablet  3  . Melatonin 3 MG TABS Take 3 mg by mouth at bedtime.      . nitroGLYCERIN (NITROSTAT) 0.4 MG SL tablet Place 0.4 mg under the tongue every 5 (five) minutes as needed for chest pain.       . simvastatin (ZOCOR) 40 MG tablet Take 0.5 tablets (20 mg total) by mouth at bedtime.  30 tablet    . testosterone cypionate (DEPOTESTOTERONE CYPIONATE) 200 MG/ML injection Inject 200 mg into the muscle every 21 ( twenty-one) days.       Marland Kitchen warfarin (COUMADIN) 1 MG tablet Take 1 mg by mouth every other day. Takes every other evening with a half of a 4 mg tablet to equal a total dose of 3 mg      . warfarin (COUMADIN) 4 MG tablet Take 2-4 mg by mouth every evening. Alternates between 3 mg and 4 mg daily. For the 3 mg dose, he takes 1/2 tab of the 4  mg tablet along with a 1 mg tablet.       No current facility-administered medications for this visit.     Past Medical History  Diagnosis Date  . CAD (coronary artery disease)     a. s/p MI;  b. s/p LAD PCI;  c. Cath 09/2007: long restenotic area, 95% stenosis in the previously placed LAD stent. The distal LAD had 30-40% stenosis. Intermediate had 30-40% stenosis. The circumflex had 40% stenosis in  obtuse marginal. The right coronary artery had 30% followed by 70-8-% stenosis followed by total occlusion. There were left to right collaterals. He had PCI of LAD w/ Taxus DES.  Marland Kitchen PAF (paroxysmal atrial fibrillation)     On chronic Coumadin Therapy  . Transient ischemic attack     Questionable  . Sleep apnea   . Prostate cancer     Post radiation  . Diabetes mellitus   . Hypertension   . Dyslipidemia   . Renal insufficiency     chronic  . GERD (gastroesophageal reflux disease)   . Arthritis   . Second degree AV block, Mobitz type I     ecg's 4/20113  . Atrial flutter     a. s/p rfca 01/2012    ROS:  All systems reviewed and negative except as noted in the HPI.   Past Surgical History  Procedure Laterality Date  . Esophagogastric fundoplication    . Carotid stent  Feb. 2011    Right   . A flutter ablation    . Hernia repair    . Cataracts    . Pacemaker insertion  02/23/13    SJM Accent DR RF implanted by DR Allred for symptomatic bradycardia  . Cardioversion N/A 06/27/2013    Procedure: CARDIOVERSION;  Surgeon: Luis Abed, MD;  Location: [facility] ENDOSCOPY;  Service: Cardiovascular;  Laterality: N/A;     Family History  Problem Relation Age of Onset  . Other      no early CAD     History   Social History  . Marital Status: Single    Spouse Name: N/A    Number of Children: 2  . Years of Education: N/A   Occupational History  . doctor     retired physician   Social History Main Topics  . Smoking status: Never Smoker   . Smokeless tobacco: Never Used  .  Alcohol Use: Yes     Comment: 6 Ounces of beer at night  . Drug Use: No  . Sexual Activity: No   Other Topics Concern  . Not on file   Social History Narrative   Lives in [place] with "lady friend."     BP 135/71  Pulse 72  Ht 5\' 11"  (1.803 m)  Wt 196 lb (88.905 kg)  BMI 27.35 kg/m2  Physical Exam:  Well appearing elderly man, NAD HEENT: Unremarkable Neck:  7 cm JVD, no thyromegally Back:  No CVA tenderness Lungs:  Clear with no wheezes, rales, or rhonchi. Well-healed pacemaker incision. HEART:  Regular rate rhythm, no murmurs, no rubs, no clicks Abd:  soft, positive bowel sounds, no organomegally, no rebound, no guarding Ext:  2 plus pulses, no edema, no cyanosis, no clubbing Skin:  No rashes no nodules Neuro:  CN II through XII intact, motor grossly intact   DEVICE  Normal device function.  See PaceArt for details. Underlying rhythm is normal sinus  Assess/Plan:

## 2013-08-08 ENCOUNTER — Ambulatory Visit: Payer: Self-pay | Admitting: Ophthalmology

## 2013-08-08 ENCOUNTER — Encounter: Payer: Self-pay | Admitting: Internal Medicine

## 2013-08-08 LAB — POTASSIUM: Potassium: 3.8 mmol/L (ref 3.5–5.1)

## 2013-08-10 ENCOUNTER — Telehealth: Payer: Self-pay | Admitting: Internal Medicine

## 2013-08-10 NOTE — Telephone Encounter (Signed)
Received request from Nurse, documents faxed for surgical clearance. To: [facility] Fax number: [phone] Attention: 08/10/13/KM

## 2013-08-14 ENCOUNTER — Telehealth: Payer: Self-pay | Admitting: Internal Medicine

## 2013-08-14 NOTE — Telephone Encounter (Signed)
Received a call from a representative with [facility]. Refaxed the patient's surgical clearance document w/addendum from [facility].

## 2013-08-22 ENCOUNTER — Ambulatory Visit: Payer: Self-pay | Admitting: Ophthalmology

## 2013-08-22 LAB — PROTIME-INR
INR: 1
Prothrombin Time: 13.8 secs (ref 11.5–14.7)

## 2013-10-29 ENCOUNTER — Telehealth: Payer: Self-pay | Admitting: Internal Medicine
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
## 2013-10-31 ENCOUNTER — Encounter: Payer: Self-pay | Admitting: Internal Medicine

## 2013-10-31 ENCOUNTER — Ambulatory Visit (INDEPENDENT_AMBULATORY_CARE_PROVIDER_SITE_OTHER): Payer: Medicare Other | Admitting: Internal Medicine

## 2013-10-31 ENCOUNTER — Ambulatory Visit (INDEPENDENT_AMBULATORY_CARE_PROVIDER_SITE_OTHER): Payer: Medicare Other | Admitting: *Deleted

## 2013-10-31 VITALS — BP 108/72 | HR 73 | Ht 71.0 in | Wt 200.1 lb

## 2013-10-31 DIAGNOSIS — R001 Bradycardia, unspecified: Secondary | ICD-10-CM

## 2013-10-31 DIAGNOSIS — I4891 Unspecified atrial fibrillation: Secondary | ICD-10-CM

## 2013-10-31 DIAGNOSIS — G459 Transient cerebral ischemic attack, unspecified: Secondary | ICD-10-CM

## 2013-10-31 DIAGNOSIS — I10 Essential (primary) hypertension: Secondary | ICD-10-CM

## 2013-10-31 DIAGNOSIS — I2510 Atherosclerotic heart disease of native coronary artery without angina pectoris: Secondary | ICD-10-CM

## 2013-10-31 DIAGNOSIS — R609 Edema, unspecified: Secondary | ICD-10-CM | POA: Insufficient documentation

## 2013-10-31 DIAGNOSIS — I4892 Unspecified atrial flutter: Secondary | ICD-10-CM

## 2013-10-31 DIAGNOSIS — I498 Other specified cardiac arrhythmias: Secondary | ICD-10-CM

## 2013-10-31 LAB — MDC_IDC_ENUM_SESS_TYPE_INCLINIC
Battery Voltage: 2.98 V
Brady Statistic RA Percent Paced: 99.69 %
Brady Statistic RV Percent Paced: 99.99 %
Date Time Interrogation Session: 20141231125457
Lead Channel Impedance Value: 525 Ohm
Lead Channel Pacing Threshold Pulse Width: 0.5 ms
Lead Channel Pacing Threshold Pulse Width: 0.5 ms
Lead Channel Sensing Intrinsic Amplitude: 5 mV
Lead Channel Setting Pacing Amplitude: 0.75 V
Lead Channel Setting Sensing Sensitivity: 6 mV

## 2013-10-31 MED ORDER — FUROSEMIDE 20 MG PO TABS: 20.0000 mg | ORAL_TABLET | Freq: Every day | ORAL | Status: DC

## 2013-10-31 NOTE — Progress Notes (Signed)
PCP:  Elmo Putt, MD Primary Cardiologist/EP- Dr Ladona Ridgel  The patient presents today for urgent/ DOD evaluation.  He has noticed recent increasing BLE edema.  His wife reports increased salt intake over the holiday.  His SOB appears to be stable.  Today, he denies symptoms of palpitations, chest pain, orthopnea, PND, dizziness, presyncope, syncope, or neurologic sequela.  The patient feels that he is tolerating medications without difficulties and is otherwise without complaint today.   Past Medical History  Diagnosis Date  . CAD (coronary artery disease)     a. s/p MI;  b. s/p LAD PCI;  c. Cath 09/2007: long restenotic area, 95% stenosis in the previously placed LAD stent. The distal LAD had 30-40% stenosis. Intermediate had 30-40% stenosis. The circumflex had 40% stenosis in  obtuse marginal. The right coronary artery had 30% followed by 70-8-% stenosis followed by total occlusion. There were left to right collaterals. He had PCI of LAD w/ Taxus DES.  Marland Kitchen PAF (paroxysmal atrial fibrillation)     On chronic Coumadin Therapy  . Transient ischemic attack     Questionable  . Sleep apnea   . Prostate cancer     Post radiation  . Diabetes mellitus   . Hypertension   . Dyslipidemia   . Renal insufficiency     chronic  . GERD (gastroesophageal reflux disease)   . Arthritis   . Second degree AV block, Mobitz type I     ecg's 4/20113  . Atrial flutter     a. s/p rfca 01/2012   Past Surgical History  Procedure Laterality Date  . Esophagogastric fundoplication    . Carotid stent  Feb. 2011    Right   . A flutter ablation    . Hernia repair    . Cataracts    . Pacemaker insertion  02/23/13    SJM Accent DR RF implanted by DR Kylii Ennis for symptomatic bradycardia  . Cardioversion N/A 06/27/2013    Procedure: CARDIOVERSION;  Surgeon: Luis Abed, MD;  Location: [facility] ENDOSCOPY;  Service: Cardiovascular;  Laterality: N/A;    Current Outpatient Prescriptions  Medication Sig Dispense Refill   . acetaminophen (TYLENOL) 500 MG tablet Take 1,000 mg by mouth daily as needed for pain.      Marland Kitchen amiodarone (PACERONE) 200 MG tablet Take 200 mg by mouth daily.      Marland Kitchen amLODipine (NORVASC) 2.5 MG tablet Take 2.5 mg by mouth every evening.       Marland Kitchen b complex vitamins capsule Take 1 capsule by mouth daily.      . dorzolamide-timolol (COSOPT) 22.3-6.8 MG/ML ophthalmic solution Place 1 drop into both eyes 2 (two) times daily.      . hydrochlorothiazide 25 MG tablet Take 25 mg by mouth daily.       . isosorbide mononitrate (IMDUR) 30 MG 24 hr tablet Take 1 tablet (30 mg total) by mouth daily.  90 tablet  3  . latanoprost (XALATAN) 0.005 % ophthalmic solution Place 1 drop into both eyes daily.      . Melatonin 3 MG TABS Take 3 mg by mouth at bedtime.      . nitroGLYCERIN (NITROSTAT) 0.4 MG SL tablet Place 0.4 mg under the tongue every 5 (five) minutes as needed for chest pain.       . prednisoLONE acetate (PRED FORTE) 1 % ophthalmic suspension Place 1 drop into both eyes daily.      . simvastatin (ZOCOR) 40 MG tablet Take 0.5 tablets (  20 mg total) by mouth at bedtime.  30 tablet    . testosterone cypionate (DEPOTESTOTERONE CYPIONATE) 200 MG/ML injection Inject 200 mg into the muscle every 21 ( twenty-one) days.       Marland Kitchen warfarin (COUMADIN) 3 MG tablet Take as directed by the coumadin clinic      . furosemide (LASIX) 20 MG tablet Take 1 tablet (20 mg total) by mouth daily.  10 tablet  0   No current facility-administered medications for this visit.    No Known Allergies  History   Social History  . Marital Status: Single    Spouse Name: N/A    Number of Children: 2  . Years of Education: N/A   Occupational History  . doctor     retired physician   Social History Main Topics  . Smoking status: Never Smoker   . Smokeless tobacco: Never Used  . Alcohol Use: Yes     Comment: 6 Ounces of beer at night  . Drug Use: No  . Sexual Activity: No   Other Topics Concern  . Not on file   Social  History Narrative   Lives in [place] with "lady friend."    Family History  Problem Relation Age of Onset  . Other      no early CAD   Physical Exam: Filed Vitals:   10/31/13 1054  BP: 108/72  Pulse: 73  Height: 5\' 11"  (1.803 m)  Weight: 200 lb 1.9 oz (90.774 kg)    GEN- The patient is elderly and chronically ill appearing, alert and oriented x 3 today.   Head- normocephalic, atraumatic Eyes-  Sclera clear, conjunctiva pink Ears- hearing is decreased Oropharynx- clear Neck- supple, no JVP Lungs- few basilar rales, mostly clear Heart- Regular rate and rhythm  GI- soft, NT, ND, + BS Extremities- no clubbing, cyanosis, +1 R>L edema MS- age appropriate atrophy Skin- no rash or lesion Psych- euthymic mood, full affect Neuro- strength and sensation are intact  ekg today reveals sinus with V pacing Device interrogation today is reviewed and reveals normal device function without recent afib  Assessment and Plan:  1. Edema Likely due to increase sodium consumption with the holidays.  He really has only mild edema and lungs are mostly clear. The importance of salt restriction (2 gram) was stressed today.  I will give lasix 20mg  daily x 2 days.  Check BMET today Return in 1-2 weeks for assessment follow-up with Brooke.  2. Second degree AV block Normal pacemaker function See Pace Art report No changes today  3. afib Maintaining sinus rhythm Continue coumadin (CHADS2VASC score is at least 5- maybe 7 if he had a prior tia)  4. HTN Stable No change required today  Return to see Nehemiah Settle in 1-2 weeks Follow-up with Dr Ladona Ridgel as scheduled

## 2013-10-31 NOTE — Patient Instructions (Signed)
Your physician recommends that you schedule a follow-up appointment in: 1 week with Rick Duff, PA  Your physician recommends that you return for lab work today [facility]  Your physician has recommended you make the following change in your medication:  1) Take Furosemide 20mg  for 2 days  2 Gram Low Sodium Diet A 2 gram sodium diet restricts the amount of sodium in the diet to no more than 2 g or 2000 mg daily. Limiting the amount of sodium is often used to help lower blood pressure. It is important if you have heart, liver, or kidney problems. Many foods contain sodium for flavor and sometimes as a preservative. When the amount of sodium in a diet needs to be low, it is important to know what to look for when choosing foods and drinks. The following includes some information and guidelines to help make it easier for you to adapt to a low sodium diet. QUICK TIPS  Do not add salt to food.  Avoid convenience items and fast food.  Choose unsalted snack foods.  Buy lower sodium products, often labeled as "lower sodium" or "no salt added."  Check food labels to learn how much sodium is in 1 serving.  When eating at a restaurant, ask that your food be prepared with less salt or none, if possible. READING FOOD LABELS FOR SODIUM INFORMATION The nutrition facts label is a good place to find how much sodium is in foods. Look for products with no more than 500 to 600 mg of sodium per meal and no more than 150 mg per serving. Remember that 2 g = 2000 mg. The food label may also list foods as:  Sodium-free: Less than 5 mg in a serving.  Very low sodium: 35 mg or less in a serving.  Low-sodium: 140 mg or less in a serving.  Light in sodium: 50% less sodium in a serving. For example, if a food that usually has 300 mg of sodium is changed to become light in sodium, it will have 150 mg of sodium.  Reduced sodium: 25% less sodium in a serving. For example, if a food that usually has 400 mg of sodium  is changed to reduced sodium, it will have 300 mg of sodium. CHOOSING FOODS Grains  Avoid: Salted crackers and snack items. Some cereals, including instant hot cereals. Bread stuffing and biscuit mixes. Seasoned rice or pasta mixes.  Choose: Unsalted snack items. Low-sodium cereals, oats, puffed wheat and rice, shredded wheat. English muffins and bread. Pasta. Meats  Avoid: Salted, canned, smoked, spiced, pickled meats, including fish and poultry. Bacon, ham, sausage, cold cuts, hot dogs, anchovies.  Choose: Low-sodium canned tuna and salmon. Fresh or frozen meat, poultry, and fish. Dairy  Avoid: Processed cheese and spreads. Cottage cheese. Buttermilk and condensed milk. Regular cheese.  Choose: Milk. Low-sodium cottage cheese. Yogurt. Sour cream. Low-sodium cheese. Fruits and Vegetables  Avoid: Regular canned vegetables. Regular canned tomato sauce and paste. Frozen vegetables in sauces. Olives. Rosita Fire. Relishes. Sauerkraut.  Choose: Low-sodium canned vegetables. Low-sodium tomato sauce and paste. Frozen or fresh vegetables. Fresh and frozen fruit. Condiments  Avoid: Canned and packaged gravies. Worcestershire sauce. Tartar sauce. Barbecue sauce. Soy sauce. Steak sauce. Ketchup. Onion, garlic, and table salt. Meat flavorings and tenderizers.  Choose: Fresh and dried herbs and spices. Low-sodium varieties of mustard and ketchup. Lemon juice. Tabasco sauce. Horseradish. SAMPLE 2 GRAM SODIUM MEAL PLAN Breakfast / Sodium (mg)  1 cup low-fat milk / 143 mg  2 slices whole-wheat  toast / 270 mg  1 tbs heart-healthy margarine / 153 mg  1 hard-boiled egg / 139 mg  1 small orange / 0 mg Lunch / Sodium (mg)  1 cup raw carrots / 76 mg   cup hummus / 298 mg  1 cup low-fat milk / 143 mg   cup red grapes / 2 mg  1 whole-wheat pita bread / 356 mg Dinner / Sodium (mg)  1 cup whole-wheat pasta / 2 mg  1 cup low-sodium tomato sauce / 73 mg  3 oz lean ground beef / 57 mg  1  small side salad (1 cup raw spinach leaves,  cup cucumber,  cup yellow bell pepper) with 1 tsp olive oil and 1 tsp red wine vinegar / 25 mg Snack / Sodium (mg)  1 container low-fat vanilla yogurt / 107 mg  3 graham cracker squares / 127 mg Nutrient Analysis  Calories: 2033  Protein: 77 g  Carbohydrate: 282 g  Fat: 72 g  Sodium: 1971 mg Document Released: 10/18/2005 Document Revised: 01/10/2012 Document Reviewed: 01/19/2010 Exit[employer] Patient Information 2014 [place], [place].

## 2013-10-31 NOTE — Progress Notes (Signed)
Pacemaker check in clinic. Pt seeing Dr Johney Frame for swelling in legs. ROV with Brooke next week.

## 2013-11-13 ENCOUNTER — Encounter: Payer: Self-pay | Admitting: Cardiology

## 2013-11-13 ENCOUNTER — Ambulatory Visit (INDEPENDENT_AMBULATORY_CARE_PROVIDER_SITE_OTHER): Payer: Medicare Other | Admitting: Cardiology

## 2013-11-13 VITALS — BP 122/60 | HR 72 | Ht 71.0 in | Wt 202.0 lb

## 2013-11-13 DIAGNOSIS — I2510 Atherosclerotic heart disease of native coronary artery without angina pectoris: Secondary | ICD-10-CM

## 2013-11-13 DIAGNOSIS — I442 Atrioventricular block, complete: Secondary | ICD-10-CM

## 2013-11-13 DIAGNOSIS — M7989 Other specified soft tissue disorders: Secondary | ICD-10-CM

## 2013-11-13 DIAGNOSIS — I4891 Unspecified atrial fibrillation: Secondary | ICD-10-CM

## 2013-11-13 DIAGNOSIS — Z950 Presence of cardiac pacemaker: Secondary | ICD-10-CM

## 2013-11-13 NOTE — Patient Instructions (Addendum)
Your physician recommends that you keep your  follow-up appointment in April with Dr. Lovena Le  Your physician recommends that you continue on your current medications as directed. Please refer to the Current Medication list given to you today.

## 2013-11-14 NOTE — Progress Notes (Signed)
Patient ID: Shawn Reyes MRN: [id number], DOB/AGE: December 23, 1920   Date of Visit: 11/13/2013  Primary Physician: Lottie Mussel, MD Primary Cardiologist: Percival Spanish, MD Primary EP: Lovena Le, MD Reason for Visit: EP/device follow-up  History of Present Illness  Shawn Reyes is a 80 y.o. male with CAD, PAF, atrial flutter s/p CTI ablation, bradycardia s/p PPM implant, OSA and chronic renal insufficiency who presents today for scheduled 2-week followup. He is accompanied by his wife. He was added on to Dr. Jackalyn Lombard clinic 2 weeks ago for LE swelling. He was instructed to reduce salt intake and take Lasix 20 mg once daily x 2 days.   Since last being seen in our clinic, he reports he is doing well and has no complaints. His LE swelling is improved. He took Lasix for 10 days. He has been careful with salt and is following low sodium diet. He denies chest pain or shortness of breath. He denies palpitations, dizziness, near syncope or syncope. He denies LE swelling, orthopnea or PND. He is compliant and tolerating medications without difficulty.  Past Medical History Past Medical History  Diagnosis Date  . CAD (coronary artery disease)     a. s/p MI;  b. s/p LAD PCI;  c. Cath 09/2007: long restenotic area, 95% stenosis in the previously placed LAD stent. The distal LAD had 30-40% stenosis. Intermediate had 30-40% stenosis. The circumflex had 40% stenosis in  obtuse marginal. The right coronary artery had 30% followed by 70-8-% stenosis followed by total occlusion. There were left to right collaterals. He had PCI of LAD w/ Taxus DES.  Marland Kitchen PAF (paroxysmal atrial fibrillation)     On chronic Coumadin Therapy  . Transient ischemic attack     Questionable  . Sleep apnea   . Prostate cancer     Post radiation  . Diabetes mellitus   . Hypertension   . Dyslipidemia   . Renal insufficiency     chronic  . GERD (gastroesophageal reflux disease)   . Arthritis   . Second degree AV block, Mobitz type I     ecg's  4/20113  . Atrial flutter     a. s/p rfca 01/2012    Past Surgical History Past Surgical History  Procedure Laterality Date  . Esophagogastric fundoplication    . Carotid stent  Feb. 2011    Right   . A flutter ablation    . Hernia repair    . Cataracts    . Pacemaker insertion  02/23/13    SJM Accent DR RF implanted by DR Allred for symptomatic bradycardia  . Cardioversion N/A 06/27/2013    Procedure: CARDIOVERSION;  Surgeon: Carlena Bjornstad, MD;  Location: [facility] ENDOSCOPY;  Service: Cardiovascular;  Laterality: N/A;    Allergies/Intolerances No Known Allergies  Current Home Medications Current Outpatient Prescriptions  Medication Sig Dispense Refill  . acetaminophen (TYLENOL) 500 MG tablet Take 1,000 mg by mouth daily as needed for pain.      Marland Kitchen amiodarone (PACERONE) 200 MG tablet Take 200 mg by mouth daily.      Marland Kitchen amLODipine (NORVASC) 2.5 MG tablet Take 2.5 mg by mouth every evening.       Marland Kitchen b complex vitamins capsule Take 1 capsule by mouth daily.      . dorzolamide-timolol (COSOPT) 22.3-6.8 MG/ML ophthalmic solution Place 1 drop into both eyes 2 (two) times daily.      . furosemide (LASIX) 20 MG tablet Take 1 tablet (20 mg total) by mouth daily.  10 tablet  0  . hydrochlorothiazide 25 MG tablet Take 25 mg by mouth daily.       . isosorbide mononitrate (IMDUR) 30 MG 24 hr tablet Take 1 tablet (30 mg total) by mouth daily.  90 tablet  3  . latanoprost (XALATAN) 0.005 % ophthalmic solution Place 1 drop into both eyes daily.      . Melatonin 3 MG TABS Take 3 mg by mouth at bedtime.      . nitroGLYCERIN (NITROSTAT) 0.4 MG SL tablet Place 0.4 mg under the tongue every 5 (five) minutes as needed for chest pain.       . prednisoLONE acetate (PRED FORTE) 1 % ophthalmic suspension Place 1 drop into both eyes daily.      . simvastatin (ZOCOR) 40 MG tablet Take 0.5 tablets (20 mg total) by mouth at bedtime.  30 tablet    . testosterone cypionate (DEPOTESTOTERONE CYPIONATE) 200 MG/ML injection  Inject 200 mg into the muscle every 21 ( twenty-one) days.       Marland Kitchen warfarin (COUMADIN) 3 MG tablet Take as directed by the coumadin clinic       No current facility-administered medications for this visit.    Social History History   Social History  . Marital Status: Single    Spouse Name: N/A    Number of Children: 2  . Years of Education: N/A   Occupational History  . doctor     retired physician   Social History Main Topics  . Smoking status: Never Smoker   . Smokeless tobacco: Never Used  . Alcohol Use: Yes     Comment: 6 Ounces of beer at night  . Drug Use: No  . Sexual Activity: No   Other Topics Concern  . Not on file   Social History Narrative   Lives in [place] with "lady friend."     Review of Systems General: No chills, fever, night sweats or weight changes Cardiovascular: No chest pain, dyspnea on exertion, edema, orthopnea, palpitations, paroxysmal nocturnal dyspnea Dermatological: No rash, lesions or masses Respiratory: No cough, dyspnea Urologic: No hematuria, dysuria Abdominal: No nausea, vomiting, diarrhea, bright red blood per rectum, melena, or hematemesis Neurologic: No visual changes, weakness, changes in mental status All other systems reviewed and are otherwise negative except as noted above.  Physical Exam Vitals: Blood pressure 122/60, pulse 72, height 5\' 11"  (1.803 m), weight 202 lb (91.627 kg).  General: Well developed, well appearing 80 y.o. male in no acute distress. HEENT: Normocephalic, atraumatic. EOMs intact. Sclera nonicteric. Oropharynx clear.  Neck: Supple. No JVD. Lungs: Respirations regular and unlabored, CTA bilaterally. No wheezes, rales or rhonchi. Heart: RRR. S1, S2 present. No murmurs, rub, S3 or S4. Abdomen: Soft, non-distended.  Extremities: No clubbing or cyanosis. Trace LE edema bilaterally at ankles. PT/Radials 2+ and equal bilaterally. Psych: Normal affect. Neuro: Alert and oriented X 3. Moves all extremities  spontaneously.   Diagnostics Device interrogation today - normal PPM function; no AFib episodes since 06/29/2013; stable impedance, sensing and thresholds; AV paced >99% of time; no programming changes made; see PaceArt report for full details  Assessment and Plan 1. LE edema - improved with Lasix and salt / sodium restriction - check BMET (pt requests this be done next week as he has labs scheduled already with PCP) 2. Complete heart block / bradycardia s/p PPM implant - normal device function - no programming changes made - return for follow-up with Dr. Lovena Le in 3 months 3. PAF - maintaining SR -  continue amiodarone as previously directed by Dr. Lovena Le - continue warfarin for stroke prevention 4. CAD - stable without anginal symptoms - continue medical therapy  Signed, Ileene Hutchinson, PA-C 11/14/2013, 9:58 PM

## 2013-11-20 LAB — MDC_IDC_ENUM_SESS_TYPE_INCLINIC
Battery Voltage: 2.98 V
Brady Statistic RA Percent Paced: 99.59 %
Implantable Pulse Generator Serial Number: 7460728
Lead Channel Pacing Threshold Amplitude: 0.375 V
Lead Channel Pacing Threshold Pulse Width: 0.5 ms
Lead Channel Setting Pacing Amplitude: 1 V
Lead Channel Setting Pacing Pulse Width: 0.5 ms
Lead Channel Setting Sensing Sensitivity: 6 mV
MDC IDC MSMT BATTERY REMAINING LONGEVITY: 111.6 mo
MDC IDC MSMT LEADCHNL RA IMPEDANCE VALUE: 562.5 Ohm
MDC IDC MSMT LEADCHNL RA PACING THRESHOLD PULSEWIDTH: 0.5 ms
MDC IDC MSMT LEADCHNL RA SENSING INTR AMPL: 4.7 mV
MDC IDC MSMT LEADCHNL RV IMPEDANCE VALUE: 725 Ohm
MDC IDC MSMT LEADCHNL RV PACING THRESHOLD AMPLITUDE: 0.75 V
MDC IDC SESS DTM: 20150113205928
MDC IDC SET LEADCHNL RA PACING AMPLITUDE: 1.375
MDC IDC STAT BRADY RV PERCENT PACED: 99.99 %

## 2013-12-03 ENCOUNTER — Encounter: Payer: Self-pay | Admitting: Internal Medicine

## 2014-02-26 ENCOUNTER — Ambulatory Visit (INDEPENDENT_AMBULATORY_CARE_PROVIDER_SITE_OTHER): Payer: Medicare Other | Admitting: Internal Medicine

## 2014-02-26 ENCOUNTER — Encounter: Payer: Self-pay | Admitting: Internal Medicine

## 2014-02-26 VITALS — BP 128/69 | HR 74 | Ht 71.0 in | Wt 204.1 lb

## 2014-02-26 DIAGNOSIS — E785 Hyperlipidemia, unspecified: Secondary | ICD-10-CM

## 2014-02-26 DIAGNOSIS — I498 Other specified cardiac arrhythmias: Secondary | ICD-10-CM

## 2014-02-26 DIAGNOSIS — I4891 Unspecified atrial fibrillation: Secondary | ICD-10-CM

## 2014-02-26 DIAGNOSIS — Z950 Presence of cardiac pacemaker: Secondary | ICD-10-CM

## 2014-02-26 LAB — MDC_IDC_ENUM_SESS_TYPE_INCLINIC
Battery Voltage: 2.98 V
Brady Statistic RV Percent Paced: 99.97 %
Date Time Interrogation Session: 20150428160356
Implantable Pulse Generator Model: 2210
Implantable Pulse Generator Serial Number: 7460728
Lead Channel Pacing Threshold Amplitude: 0.375 V
Lead Channel Pacing Threshold Amplitude: 0.625 V
Lead Channel Pacing Threshold Pulse Width: 0.5 ms
Lead Channel Pacing Threshold Pulse Width: 0.5 ms
Lead Channel Sensing Intrinsic Amplitude: 12 mV
Lead Channel Sensing Intrinsic Amplitude: 2.9 mV
Lead Channel Setting Pacing Amplitude: 0.875
Lead Channel Setting Sensing Sensitivity: 6 mV
MDC IDC MSMT BATTERY REMAINING LONGEVITY: 108 mo
MDC IDC MSMT LEADCHNL RA IMPEDANCE VALUE: 487.5 Ohm
MDC IDC MSMT LEADCHNL RV IMPEDANCE VALUE: 700 Ohm
MDC IDC SET LEADCHNL RA PACING AMPLITUDE: 1.375
MDC IDC SET LEADCHNL RV PACING PULSEWIDTH: 0.5 ms
MDC IDC STAT BRADY RA PERCENT PACED: 94 %

## 2014-02-26 NOTE — Patient Instructions (Addendum)
Your physician wants you to follow-up in: 6 months with Dr Knox Saliva will receive a reminder letter in the mail two months in advance. If you don't receive a letter, please call our office to schedule the follow-up appointment.    Remote monitoring is used to monitor your Pacemaker or ICD from home. This monitoring reduces the number of office visits required to check your device to one time per year. It allows [place] to keep an eye on the functioning of your device to ensure it is working properly. You are scheduled for a device check from home on 05/30/14. You may send your transmission at any time that day. If you have a wireless device, the transmission will be sent automatically. After your physician reviews your transmission, you will receive a postcard with your next transmission date.  Your physician has recommended you make the following change in your medication:  1)Stop Imdur 2) Stop Simvastatin

## 2014-02-26 NOTE — Progress Notes (Signed)
HPI [name] returns today for followup. 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 Because of severe bradycardia, he underwent pacemaker insertion several months ago. Otherwise he is been stable. He denies chest pain, shortness of breath, or syncope. No peripheral edema. He feels fatigue and weakness. No energy.  No Known Allergies   Current Outpatient Prescriptions  Medication Sig Dispense Refill  . acetaminophen (TYLENOL) 500 MG tablet Take 1,000 mg by mouth daily as needed for pain.      Marland Kitchen amiodarone (PACERONE) 200 MG tablet Take 200 mg by mouth daily.      Marland Kitchen amLODipine (NORVASC) 2.5 MG tablet Take 2.5 mg by mouth every evening.       Marland Kitchen b complex vitamins capsule Take 1 capsule by mouth daily.      . diazepam (VALIUM) 5 MG tablet Take 5 mg by mouth at bedtime.      . dorzolamide-timolol (COSOPT) 22.3-6.8 MG/ML ophthalmic solution Place 1 drop into both eyes 2 (two) times daily.      . hydrochlorothiazide 25 MG tablet Take 12.5 mg by mouth daily.       . isosorbide mononitrate (IMDUR) 30 MG 24 hr tablet Take 1 tablet (30 mg total) by mouth daily.  90 tablet  3  . latanoprost (XALATAN) 0.005 % ophthalmic solution Place 1 drop into both eyes daily.      . Melatonin 3 MG TABS Take 6 mg by mouth at bedtime.       . nitroGLYCERIN (NITROSTAT) 0.4 MG SL tablet Place 0.4 mg under the tongue every 5 (five) minutes as needed for chest pain.       . prednisoLONE acetate (PRED FORTE) 1 % ophthalmic suspension Place 1 drop into both eyes daily.      . simvastatin (ZOCOR) 40 MG tablet Take 0.5 tablets (20 mg total) by mouth at bedtime.  30 tablet    . testosterone cypionate (DEPOTESTOTERONE CYPIONATE) 200 MG/ML injection Inject 200 mg into the muscle every 21 ( twenty-one) days.       Marland Kitchen warfarin (COUMADIN) 3 MG tablet  Take as directed by the coumadin clinic       No current facility-administered medications for this visit.     Past Medical History  Diagnosis Date  . CAD (coronary artery disease)     a. s/p MI;  b. s/p LAD PCI;  c. Cath 09/2007: long restenotic area, 95% stenosis in the previously placed LAD stent. The distal LAD had 30-40% stenosis. Intermediate had 30-40% stenosis. The circumflex had 40% stenosis in  obtuse marginal. The right coronary artery had 30% followed by 70-8-% stenosis followed by total occlusion. There were left to right collaterals. He had PCI of LAD w/ Taxus DES.  Marland Kitchen PAF (paroxysmal atrial fibrillation)     On chronic Coumadin Therapy  . Transient ischemic attack     Questionable  . Sleep apnea   . Prostate cancer     Post radiation  . Diabetes mellitus   . Hypertension   . Dyslipidemia   . Renal insufficiency     chronic  . GERD (gastroesophageal reflux disease)   . Arthritis   . Second degree AV block, Mobitz type I     ecg's 4/20113  . Atrial flutter     a. s/p rfca 01/2012    ROS:   All systems  reviewed and negative except as noted in the HPI.   Past Surgical History  Procedure Laterality Date  . Esophagogastric fundoplication    . Carotid stent  Feb. 2011    Right   . A flutter ablation    . Hernia repair    . Cataracts    . Pacemaker insertion  02/23/13    SJM Accent DR RF implanted by DR Allred for symptomatic bradycardia  . Cardioversion N/A 06/27/2013    Procedure: CARDIOVERSION;  Surgeon: Carlena Bjornstad, MD;  Location: [facility] ENDOSCOPY;  Service: Cardiovascular;  Laterality: N/A;     Family History  Problem Relation Age of Onset  . Other      no early CAD     History   Social History  . Marital Status: Single    Spouse Name: N/A    Number of Children: 2  . Years of Education: N/A   Occupational History  . doctor     retired physician   Social History Main Topics  . Smoking status: Never Smoker   . Smokeless tobacco: Never Used   . Alcohol Use: Yes     Comment: 6 Ounces of beer at night  . Drug Use: No  . Sexual Activity: No   Other Topics Concern  . Not on file   Social History Narrative   Lives in [place] with "lady friend."     BP 128/69  Pulse 74  Ht 5\' 11"  (1.803 m)  Wt 204 lb 1.9 oz (92.588 kg)  BMI 28.48 kg/m2  Physical Exam:  Well appearing elderly man, NAD HEENT: Unremarkable Neck:  7 cm JVD, no thyromegally Back:  No CVA tenderness Lungs:  Clear with no wheezes, rales, or rhonchi. Well-healed pacemaker incision. HEART:  Regular rate rhythm, no murmurs, no rubs, no clicks Abd:  soft, positive bowel sounds, no organomegally, no rebound, no guarding Ext:  2 plus pulses, no edema, no cyanosis, no clubbing Skin:  No rashes no nodules Neuro:  CN II through XII intact, motor grossly intact   DEVICE  Normal device function.  See PaceArt for details. Underlying rhythm is normal sinus  Assess/Plan:

## 2014-02-26 NOTE — Assessment & Plan Note (Signed)
He is feeling fatigue and weakness. Will stop his statin and his long acting nitrates.

## 2014-02-26 NOTE — Assessment & Plan Note (Signed)
His [employer] Jude DDD PM is working normally. Will recheck in several months. 

## 2014-02-28 ENCOUNTER — Encounter: Payer: Self-pay | Admitting: Internal Medicine

## 2014-03-08 ENCOUNTER — Encounter: Payer: Self-pay | Admitting: Internal Medicine

## 2014-04-15 ENCOUNTER — Encounter: Payer: Self-pay | Admitting: Internal Medicine

## 2014-04-23 ENCOUNTER — Telehealth: Payer: Self-pay | Admitting: Internal Medicine

## 2014-04-23 NOTE — Telephone Encounter (Signed)
New message      C/o chest pressure, very weak where he cannot get out of bed

## 2014-04-23 NOTE — Telephone Encounter (Signed)
Spoke with Shawn Reyes lives with patient a Shawn Reyes. Pt has been having fullness/heaviness in chest since yesterday. Today he is tired and weak and does not feel like getting out of bed, he continues to have fullness  in his chest at this time. I advised Ms Shawn Reyes to call 911 to transport pt to ED for further evaluation.

## 2014-05-01 ENCOUNTER — Encounter: Payer: Self-pay | Admitting: Internal Medicine

## 2014-05-15 ENCOUNTER — Telehealth: Payer: Self-pay

## 2014-05-15 NOTE — Telephone Encounter (Signed)
Patient died @ [place] per [employer]

## 2014-06-01 DEATH — deceased

## 2014-10-10 ENCOUNTER — Encounter (HOSPITAL_COMMUNITY): Payer: Self-pay | Admitting: Internal Medicine

## 2015-02-21 NOTE — Op Note (Signed)
PATIENT NAME:  Shawn Reyes, Shawn Reyes MR#:  [id number] DATE OF BIRTH:  14-Feb-1921  DATE OF PROCEDURE:  08/22/2013  PROCEDURES PERFORMED: 1.  Pars plana vitrectomy of the left eye.  2.  Internal limiting membrane peel of the left eye.   PREOPERATIVE DIAGNOSES: 1.  Epiretinal membrane.  2.  Retinal edema, left eye.   POSTOPERATIVE DIAGNOSES:   1.  Epiretinal membrane.  2.  Retinal edema, left eye.   ESTIMATED BLOOD LOSS: Less than 1 mL.   PRIMARY SURGEON:  Garlan Fair, M.D.   ANESTHESIA: Retrobulbar block of the left eye with monitored anesthesia care.   COMPLICATIONS: None.   INDICATIONS FOR PROCEDURE: The patient presented to my office with slowly decreasing vision to 20/60 in the left eye with difficulty reading. Examination revealed an epiretinal membrane and associated retinal edema of the left eye. Risks, benefits and alternatives of the above procedure were discussed, and the patient wished to proceed.   DETAILS OF PROCEDURE: After informed consent was obtained, the patient was brought into the operative suite at [facility]. The patient was placed in supine position and was given a small dose of Alfenta and a retrobulbar block was performed on the left eye by the primary surgeon without any complication. The left eye was prepped and draped in a sterile manner. After a lid speculum was inserted, a 25-gauge trocar was placed inferotemporally through displaced conjunctiva in an oblique fashion 3 mm beyond the limbus. The infusion cannula was turned on and inserted through the trocar and secured in position with Steri-Strips. Two more trocars were placed in a similar fashion superotemporally and superonasally. The vitreous cutter and light pipe were introduced in the eye and a core vitrectomy was performed.  Vitreous face was confirmed as already elevated. Peripheral vitreous was trimmed for 360 degrees. Indocyanine green was injected onto the posterior pole and removed  within 30 seconds. An internal limiting membrane peel was performed for 360 degrees around the fovea without any complications. Total diameter of the peel was 2 disc diameters. Scleral depressed exam was performed for 360 degrees. No signs of any breaks, tears or retinal detachment could be identified for 360 degrees. A partial air-fluid exchange was performed. The trocars were removed, and the wounds were noted to be airtight. Pressure in the eye was confirmed to be approximately 15 mmHg; 5 mg of dexamethasone was given into the inferior fornix and the lid speculum was removed. The eye was cleaned and TobraDex was placed on the eye. A patch and shield was placed over the eye, and the patient was taken to the postanesthesia care and was instructed to remain head up.     ____________________________ Teresa Pelton. Starling Manns, MD mfa:dmm D: 08/22/2013 09:46:00 ET T: 08/22/2013 10:13:06 ET JOB#: [id number]  cc: Teresa Pelton. Starling Manns, MD, <Dictator> Coralee Rud MD ELECTRONICALLY SIGNED 08/29/2013 7:10
